# Patient Record
Sex: Male | Born: 1960 | Marital: Married | Smoking: Current every day smoker
Health system: Southern US, Community
[De-identification: ages and names within clinical notes are randomized; demographics above are authoritative.]

## PROBLEM LIST (undated history)

## (undated) DIAGNOSIS — N529 Male erectile dysfunction, unspecified: Secondary | ICD-10-CM

## (undated) DIAGNOSIS — E119 Type 2 diabetes mellitus without complications: Secondary | ICD-10-CM

## (undated) DIAGNOSIS — I10 Essential (primary) hypertension: Secondary | ICD-10-CM

## (undated) DIAGNOSIS — F32A Depression, unspecified: Secondary | ICD-10-CM

## (undated) DIAGNOSIS — S8291XA Unspecified fracture of right lower leg, initial encounter for closed fracture: Secondary | ICD-10-CM

## (undated) DIAGNOSIS — F329 Major depressive disorder, single episode, unspecified: Secondary | ICD-10-CM

## (undated) DIAGNOSIS — E785 Hyperlipidemia, unspecified: Secondary | ICD-10-CM

## (undated) HISTORY — PX: BRAIN SURGERY: SHX531

---

## 2014-08-05 ENCOUNTER — Emergency Department (HOSPITAL_COMMUNITY): Payer: Non-veteran care

## 2014-08-05 ENCOUNTER — Encounter (HOSPITAL_COMMUNITY): Payer: Self-pay

## 2014-08-05 ENCOUNTER — Inpatient Hospital Stay (HOSPITAL_COMMUNITY)
Admission: EM | Admit: 2014-08-05 | Discharge: 2014-08-14 | DRG: 871 | Disposition: A | Discharge status: Home Health | Payer: Non-veteran care | Attending: Internal Medicine | Admitting: Internal Medicine

## 2014-08-05 DIAGNOSIS — F1010 Alcohol abuse, uncomplicated: Secondary | ICD-10-CM | POA: Diagnosis present

## 2014-08-05 DIAGNOSIS — F32A Depression, unspecified: Secondary | ICD-10-CM | POA: Diagnosis present

## 2014-08-05 DIAGNOSIS — N12 Tubulo-interstitial nephritis, not specified as acute or chronic: Secondary | ICD-10-CM | POA: Diagnosis present

## 2014-08-05 DIAGNOSIS — N390 Urinary tract infection, site not specified: Secondary | ICD-10-CM | POA: Diagnosis not present

## 2014-08-05 DIAGNOSIS — E785 Hyperlipidemia, unspecified: Secondary | ICD-10-CM | POA: Diagnosis present

## 2014-08-05 DIAGNOSIS — E119 Type 2 diabetes mellitus without complications: Secondary | ICD-10-CM

## 2014-08-05 DIAGNOSIS — E876 Hypokalemia: Secondary | ICD-10-CM | POA: Diagnosis present

## 2014-08-05 DIAGNOSIS — A4102 Sepsis due to Methicillin resistant Staphylococcus aureus: Principal | ICD-10-CM | POA: Diagnosis present

## 2014-08-05 DIAGNOSIS — N200 Calculus of kidney: Secondary | ICD-10-CM | POA: Diagnosis present

## 2014-08-05 DIAGNOSIS — A419 Sepsis, unspecified organism: Secondary | ICD-10-CM

## 2014-08-05 DIAGNOSIS — F329 Major depressive disorder, single episode, unspecified: Secondary | ICD-10-CM | POA: Diagnosis present

## 2014-08-05 DIAGNOSIS — G9341 Metabolic encephalopathy: Secondary | ICD-10-CM | POA: Diagnosis present

## 2014-08-05 DIAGNOSIS — K7290 Hepatic failure, unspecified without coma: Secondary | ICD-10-CM | POA: Diagnosis present

## 2014-08-05 DIAGNOSIS — R32 Unspecified urinary incontinence: Secondary | ICD-10-CM | POA: Diagnosis present

## 2014-08-05 DIAGNOSIS — I639 Cerebral infarction, unspecified: Secondary | ICD-10-CM

## 2014-08-05 DIAGNOSIS — I10 Essential (primary) hypertension: Secondary | ICD-10-CM | POA: Diagnosis present

## 2014-08-05 DIAGNOSIS — D649 Anemia, unspecified: Secondary | ICD-10-CM | POA: Diagnosis present

## 2014-08-05 DIAGNOSIS — R7989 Other specified abnormal findings of blood chemistry: Secondary | ICD-10-CM | POA: Diagnosis present

## 2014-08-05 DIAGNOSIS — E871 Hypo-osmolality and hyponatremia: Secondary | ICD-10-CM | POA: Diagnosis present

## 2014-08-05 DIAGNOSIS — R4182 Altered mental status, unspecified: Secondary | ICD-10-CM | POA: Diagnosis present

## 2014-08-05 DIAGNOSIS — D696 Thrombocytopenia, unspecified: Secondary | ICD-10-CM | POA: Diagnosis present

## 2014-08-05 DIAGNOSIS — D638 Anemia in other chronic diseases classified elsewhere: Secondary | ICD-10-CM | POA: Diagnosis present

## 2014-08-05 DIAGNOSIS — B9562 Methicillin resistant Staphylococcus aureus infection as the cause of diseases classified elsewhere: Secondary | ICD-10-CM | POA: Diagnosis present

## 2014-08-05 DIAGNOSIS — R7881 Bacteremia: Secondary | ICD-10-CM | POA: Diagnosis present

## 2014-08-05 DIAGNOSIS — R319 Hematuria, unspecified: Secondary | ICD-10-CM

## 2014-08-05 DIAGNOSIS — F17210 Nicotine dependence, cigarettes, uncomplicated: Secondary | ICD-10-CM | POA: Diagnosis present

## 2014-08-05 DIAGNOSIS — D759 Disease of blood and blood-forming organs, unspecified: Secondary | ICD-10-CM | POA: Diagnosis present

## 2014-08-05 HISTORY — DX: Male erectile dysfunction, unspecified: N52.9

## 2014-08-05 HISTORY — DX: Type 2 diabetes mellitus without complications: E11.9

## 2014-08-05 HISTORY — DX: Hyperlipidemia, unspecified: E78.5

## 2014-08-05 HISTORY — DX: Depression, unspecified: F32.A

## 2014-08-05 HISTORY — DX: Essential (primary) hypertension: I10

## 2014-08-05 HISTORY — DX: Major depressive disorder, single episode, unspecified: F32.9

## 2014-08-05 HISTORY — DX: Unspecified fracture of right lower leg, initial encounter for closed fracture: S82.91XA

## 2014-08-05 LAB — DIFFERENTIAL
BASOS ABS: 0 10*3/uL (ref 0.0–0.1)
Basophils Relative: 0 % (ref 0–1)
EOS ABS: 0 10*3/uL (ref 0.0–0.7)
Eosinophils Relative: 0 % (ref 0–5)
LYMPHS ABS: 0.8 10*3/uL (ref 0.7–4.0)
LYMPHS PCT: 11 % — AB (ref 12–46)
MONO ABS: 0.4 10*3/uL (ref 0.1–1.0)
MONOS PCT: 6 % (ref 3–12)
Neutro Abs: 6 10*3/uL (ref 1.7–7.7)
Neutrophils Relative %: 83 % — ABNORMAL HIGH (ref 43–77)

## 2014-08-05 LAB — COMPREHENSIVE METABOLIC PANEL
ALK PHOS: 95 U/L (ref 39–117)
ALT: 27 U/L (ref 0–53)
AST: 69 U/L — AB (ref 0–37)
Albumin: 3.4 g/dL — ABNORMAL LOW (ref 3.5–5.2)
Anion gap: 14 (ref 5–15)
BILIRUBIN TOTAL: 3.1 mg/dL — AB (ref 0.3–1.2)
BUN: 13 mg/dL (ref 6–23)
CHLORIDE: 101 mmol/L (ref 96–112)
CO2: 21 mmol/L (ref 19–32)
Calcium: 9.3 mg/dL (ref 8.4–10.5)
Creatinine, Ser: 1.04 mg/dL (ref 0.50–1.35)
GFR calc non Af Amer: 79 mL/min — ABNORMAL LOW (ref >90)
GLUCOSE: 120 mg/dL — AB (ref 70–99)
POTASSIUM: 4.4 mmol/L (ref 3.5–5.1)
SODIUM: 136 mmol/L (ref 135–145)
Total Protein: 7.4 g/dL (ref 6.0–8.3)

## 2014-08-05 LAB — URINALYSIS, ROUTINE W REFLEX MICROSCOPIC
Glucose, UA: 100 mg/dL — AB
Ketones, ur: 40 mg/dL — AB
Nitrite: POSITIVE — AB
Specific Gravity, Urine: 1.014 (ref 1.005–1.030)
UROBILINOGEN UA: 1 mg/dL (ref 0.0–1.0)
pH: 6 (ref 5.0–8.0)

## 2014-08-05 LAB — CBC
HCT: 38.8 % — ABNORMAL LOW (ref 39.0–52.0)
Hemoglobin: 12.8 g/dL — ABNORMAL LOW (ref 13.0–17.0)
MCH: 31.4 pg (ref 26.0–34.0)
MCHC: 33 g/dL (ref 30.0–36.0)
MCV: 95.1 fL (ref 78.0–100.0)
PLATELETS: 45 10*3/uL — AB (ref 150–400)
RBC: 4.08 MIL/uL — ABNORMAL LOW (ref 4.22–5.81)
RDW: 15.6 % — ABNORMAL HIGH (ref 11.5–15.5)
WBC: 7.2 10*3/uL (ref 4.0–10.5)

## 2014-08-05 LAB — I-STAT CHEM 8, ED
BUN: 14 mg/dL (ref 6–23)
CREATININE: 0.8 mg/dL (ref 0.50–1.35)
Calcium, Ion: 1.08 mmol/L — ABNORMAL LOW (ref 1.12–1.23)
Chloride: 101 mmol/L (ref 96–112)
Glucose, Bld: 127 mg/dL — ABNORMAL HIGH (ref 70–99)
HCT: 42 % (ref 39.0–52.0)
Hemoglobin: 14.3 g/dL (ref 13.0–17.0)
Potassium: 4.3 mmol/L (ref 3.5–5.1)
Sodium: 136 mmol/L (ref 135–145)
TCO2: 18 mmol/L (ref 0–100)

## 2014-08-05 LAB — RAPID URINE DRUG SCREEN, HOSP PERFORMED
Amphetamines: NOT DETECTED
Barbiturates: NOT DETECTED
Benzodiazepines: NOT DETECTED
COCAINE: NOT DETECTED
Opiates: NOT DETECTED
Tetrahydrocannabinol: NOT DETECTED

## 2014-08-05 LAB — PROTIME-INR
INR: 1.35 (ref 0.00–1.49)
Prothrombin Time: 16.8 seconds — ABNORMAL HIGH (ref 11.6–15.2)

## 2014-08-05 LAB — URINE MICROSCOPIC-ADD ON

## 2014-08-05 LAB — CBG MONITORING, ED: Glucose-Capillary: 157 mg/dL — ABNORMAL HIGH (ref 70–99)

## 2014-08-05 LAB — APTT: APTT: 30 s (ref 24–37)

## 2014-08-05 LAB — I-STAT CG4 LACTIC ACID, ED: Lactic Acid, Venous: 5.07 mmol/L (ref 0.5–2.0)

## 2014-08-05 LAB — AMMONIA: Ammonia: 86 umol/L — ABNORMAL HIGH (ref 11–32)

## 2014-08-05 LAB — ETHANOL

## 2014-08-05 LAB — I-STAT TROPONIN, ED: Troponin i, poc: 0 ng/mL (ref 0.00–0.08)

## 2014-08-05 MED ORDER — LORAZEPAM 2 MG/ML IJ SOLN
1.0000 mg | Freq: Once | INTRAMUSCULAR | Status: DC
Start: 2014-08-05 — End: 2014-08-07
  Filled 2014-08-05: qty 1

## 2014-08-05 MED ORDER — DEXTROSE 5 % IV SOLN
1.0000 g | Freq: Once | INTRAVENOUS | Status: AC
Start: 2014-08-06 — End: 2014-08-06
  Administered 2014-08-05: 1 g via INTRAVENOUS
  Filled 2014-08-05: qty 10

## 2014-08-05 MED ORDER — SODIUM CHLORIDE 0.9 % IV BOLUS (SEPSIS)
500.0000 mL | INTRAVENOUS | Status: AC
  Administered 2014-08-06: 500 mL via INTRAVENOUS

## 2014-08-05 MED ORDER — CEFTRIAXONE SODIUM 1 G IJ SOLR
1.0000 g | Freq: Once | INTRAMUSCULAR | Status: AC
  Administered 2014-08-05: 1 g via INTRAVENOUS
  Filled 2014-08-05: qty 10

## 2014-08-05 MED ORDER — SODIUM CHLORIDE 0.9 % IV BOLUS (SEPSIS)
1000.0000 mL | INTRAVENOUS | Status: AC
  Administered 2014-08-06: 1000 mL via INTRAVENOUS

## 2014-08-05 MED ORDER — SODIUM CHLORIDE 0.9 % IV BOLUS (SEPSIS)
1000.0000 mL | Freq: Once | INTRAVENOUS | Status: AC
Start: 2014-08-05 — End: 2014-08-06
  Administered 2014-08-05: 1000 mL via INTRAVENOUS

## 2014-08-05 NOTE — ED Notes (Signed)
Family has not yet arrived.

## 2014-08-05 NOTE — ED Notes (Signed)
Called Lvl 2 Code Sepsis to [facility] @2350 

## 2014-08-05 NOTE — ED Notes (Signed)
Pt wife talked with April Pugh, RN - 1700 pt stated he was sick; vomited and couldn't remember how to put car in gear, stopped car & got in back of car, went to sleep. Pt has hx of kidney infection x1 month ago. Pt did not drink today, but typically drinks 3 24oz each night.

## 2014-08-05 NOTE — Consult Note (Signed)
Neurology Consultation Reason for Consult: Altered Mental Status Referring Physician: Ardeen JourdainBelfi, M  CC: Altered Mental status  History is obtained from:patient, EMS  HPI: John Santiago is a 54 y.o. male with a history of hypertension who presents with AMS. He had an episode of vomiting earlier, and subsequently was confused and therefore lay down in the back of the car. After arousing him, he was seen to be weak on the left side and EMS was called. EMS states that he did appear to be weak on the left side brought into the emergency room. Glucose was 135 in the field. By the time of arrival to the emergency room, his left-sided weakness had resolved with persistent confusion and left facial drooping.  He was evaluated as a code stroke, taken for emergent MRI which was negative.   LKW: unclear tpa given?: no,     ROS:  Unable to obtain due to altered mental status.   PMH: HTN  Family History: Unable to assess secondary to patient's altered mental status.    Social History: Per nursing contacted his family, he drinks 32 4 ounce beers per day, last drink was yesterday.    Exam: Current vital signs: BP 165/83 mmHg  Pulse 107  Temp(Src) 98.5 F (36.9 C) (Oral)  Resp 25  SpO2 96% Vital signs in last 24 hours: Temp:  [98.5 F (36.9 C)] 98.5 F (36.9 C) (03/21 2053) Pulse Rate:  [107] 107 (03/21 2053) Resp:  [25] 25 (03/21 2053) BP: (165)/(83) 165/83 mmHg (03/21 2053) SpO2:  [96 %] 96 % (03/21 2053)  Physical Exam  Constitutional: Appears well-developed and well-nourished.  Psych: somnolent, but does interact Eyes: No scleral injection HENT: No OP obstrucion Head: Normocephalic.  Cardiovascular: Normal rate and regular rhythm.  Respiratory: Effort normal and breath sounds normal to anterior ascultation GI: Soft.  No distension. There is no tenderness.  Skin: WDI  Neuro: Mental Status: Patient is lethargic, but when aroused will answer questions. He is able to give the  month, does not get the year. He is able to follow some simple commands, but does not do so reliably. Cranial Nerves: II: Visual Fields are full. Pupils are equal, round, and reactive to light.   III,IV, VI: EOMI without ptosis or diploplia.  V: Facial sensation is symmetric to temperature VII: Facial movement is notable for left facial droop VIII: hearing is intact to voice X: Uvula elevates symmetrically XI: Shoulder shrug is symmetric. XII: tongue is midline without atrophy or fasciculations.  Motor: Tone is normal. Bulk is normal. 5/5 strength was present in all four extremities.  Sensory: Sensation is symmetric to light touch and temperature in the arms and legs. Deep Tendon Reflexes: 2+ and symmetric in the biceps and patellae.  Cerebellar: He has marketed tremor bilaterally and slow to do finger-nose-finger, but no clear ataxia.   I have reviewed labs in epic and the results pertinent to this consultation are: Ammonia 82  I have reviewed the images obtained: MRI brain-negative  Impression: 54 year old male presenting with altered mental status. His exam at the time of my seeing him was much more consistent with a metabolic encephalopathy than stroke. His elevated ammonia level does give question to the possibility that this is a hepatic encephalopathy. The focal symptoms that he had earlier would be quite unusual. Seizures do happen sometimes in the context of hepatic encephalopathy.  Recommendations: 1) EEG 2) would treat suspected hepatic encephalopathy 3) neurology will continue to follow.   Ritta SlotMcNeill Jarom Govan, MD [facility] [phone]  If 7pm- 7am, please page neurology on call as listed in [place].

## 2014-08-05 NOTE — Code Documentation (Signed)
Mr. Reola CalkinsGoode is a 53yo wm that was [facility] after pulling over while driving and telling his wife he could no longer drive.  Initially he told EMS he had a HA & was found to have Lt side weakness.  [facility] he was altered but responsive, speech clear, bil tremors, but unable to answer questions consistently.  Unclear LKW as the family is unavailable. NIH 5 mostly for mental status, drift on BLE.  Pt states he was in the [employer] and has HTN but unclear meds, and unable to identify his PCP.

## 2014-08-05 NOTE — ED Provider Notes (Signed)
CSN: [id number]     Arrival date & time 08/05/14  2023 History   First MD Initiated Contact with Patient 08/05/14 2027     Chief Complaint  Patient presents with  . Code Stroke     (Consider location/radiation/quality/duration/timing/severity/associated sxs/prior Treatment) HPI Comments: Patient presents as a code stroke. Per EMS he was driving with his wife when he pulled over and said that he was unable to drive anymore. Per EMS, they felt he had some left-sided facial drooping and left-sided weakness. He denies any chest pain or shortness of breath. He was incontinent of urine. He denies any recent illnesses. History is limited due to his condition. He does report that he's been drinking alcohol tonight.   History reviewed. No pertinent past medical history. No past surgical history on file. No family history on file. History  Substance Use Topics  . Smoking status: Not on file  . Smokeless tobacco: Not on file  . Alcohol Use: Not on file    Review of Systems  Unable to perform ROS: Mental status change      Allergies  Review of patient's allergies indicates not on file.  Home Medications   Prior to Admission medications   Not on File   BP 138/77 mmHg  Pulse 112  Temp(Src) 98.5 F (36.9 C) (Oral)  Resp 25  SpO2 95% Physical Exam  Constitutional: He appears well-developed and well-nourished.  HENT:  Head: Normocephalic and atraumatic.  Eyes: Pupils are equal, round, and reactive to light.  Neck: Normal range of motion. Neck supple.  Cardiovascular: Normal rate, regular rhythm and normal heart sounds.   Pulmonary/Chest: Effort normal and breath sounds normal. No respiratory distress. He has no wheezes. He has no rales. He exhibits no tenderness.  Abdominal: Soft. Bowel sounds are normal. There is no tenderness. There is no rebound and no guarding.  Musculoskeletal: Normal range of motion. He exhibits no edema.  Lymphadenopathy:    He has no cervical adenopathy.   Neurological: He is alert.  Patient is very slow to answer questions. He seems disoriented. He does have a little bit of left-sided facial drooping. He has a limited exam but he seems to have normal motor strength in all extremities. He has a tremor to his upper extremities bilaterally which appears to be chronic per his report.  Skin: Skin is warm and dry. No rash noted.  Psychiatric: He has a normal mood and affect.    ED Course  Procedures (including critical care time) Labs Review Labs Reviewed  PROTIME-INR - Abnormal; Notable for the following:    Prothrombin Time 16.8 (*)    All other components within normal limits  CBC - Abnormal; Notable for the following:    RBC 4.08 (*)    Hemoglobin 12.8 (*)    HCT 38.8 (*)    RDW 15.6 (*)    Platelets 45 (*)    All other components within normal limits  DIFFERENTIAL - Abnormal; Notable for the following:    Neutrophils Relative % 83 (*)    Lymphocytes Relative 11 (*)    All other components within normal limits  COMPREHENSIVE METABOLIC PANEL - Abnormal; Notable for the following:    Glucose, Bld 120 (*)    Albumin 3.4 (*)    AST 69 (*)    Total Bilirubin 3.1 (*)    GFR calc non Af Amer 79 (*)    All other components within normal limits  URINALYSIS, ROUTINE W REFLEX MICROSCOPIC - Abnormal; Notable  for the following:    Color, Urine RED (*)    APPearance TURBID (*)    Glucose, UA 100 (*)    Hgb urine dipstick LARGE (*)    Bilirubin Urine LARGE (*)    Ketones, ur 40 (*)    Protein, ur >300 (*)    Nitrite POSITIVE (*)    Leukocytes, UA LARGE (*)    All other components within normal limits  AMMONIA - Abnormal; Notable for the following:    Ammonia 86 (*)    All other components within normal limits  URINE MICROSCOPIC-ADD ON - Abnormal; Notable for the following:    Bacteria, UA MANY (*)    All other components within normal limits  I-STAT CHEM 8, ED - Abnormal; Notable for the following:    Glucose, Bld 127 (*)     Calcium, Ion 1.08 (*)    All other components within normal limits  CBG MONITORING, ED - Abnormal; Notable for the following:    Glucose-Capillary 157 (*)    All other components within normal limits  I-STAT CG4 LACTIC ACID, ED - Abnormal; Notable for the following:    Lactic Acid, Venous 5.07 (*)    All other components within normal limits  URINE CULTURE  CULTURE, BLOOD (ROUTINE X 2)  CULTURE, BLOOD (ROUTINE X 2)  ETHANOL  APTT  URINE RAPID DRUG SCREEN (HOSP PERFORMED)  LIPASE, BLOOD  I-STAT TROPOININ, ED  I-STAT TROPOININ, ED  I-STAT CG4 LACTIC ACID, ED    Imaging Review Ct Head Wo Contrast  08/05/2014   CLINICAL DATA:  Slurred speech and facial droop today. Some confusion.  EXAM: CT HEAD WITHOUT CONTRAST  TECHNIQUE: Contiguous axial images were obtained from the base of the skull through the vertex without intravenous contrast.  COMPARISON:  None.  FINDINGS: Age advanced cerebral atrophy and ventriculomegaly. No extra-axial fluid collections are identified. No CT findings for acute hemispheric infarction or intracranial hemorrhage. No mass lesions. The brainstem and cerebellum are grossly normal. Vascular calcifications are noted.  The bony structures are intact. The paranasal sinuses and mastoid air cells are clear. Patient has had prior left mastoid surgery with mastoid bowl procedure.  IMPRESSION: No CT findings for an acute intracranial process.   Electronically Signed   By: Rudie Meyer M.D.   On: 08/05/2014 20:48   Mr Brain Wo Contrast  08/05/2014   CLINICAL DATA:  54 year old male code stroke with slurred speech and facial droop. Initial encounter.  EXAM: MRI HEAD WITHOUT CONTRAST  TECHNIQUE: Multiplanar, multiecho pulse sequences of the brain and surrounding structures were obtained without intravenous contrast.  COMPARISON:  Head CT without contrast 2037 hours the same day.  FINDINGS: Noncontrast MRI imaging of the brain was limited to diffusion-weighted (axial and coronal),  and axial FLAIR imaging.  No restricted diffusion or evidence of acute infarction. Patchy cerebral white matter FLAIR hyperintensity, mild to moderate for age. No cortical encephalomalacia identified. No midline shift, mass effect, or evidence of intracranial mass lesion. No ventriculomegaly.  IMPRESSION: No evidence of acute ischemia. Study discussed by telephone with Dr. Ritta Slot on 08/05/2014 at 21:44 .   Electronically Signed   By: Odessa Fleming M.D.   On: 08/05/2014 21:45     EKG Interpretation   Date/Time:  Monday August 05 2014 20:43:35 EDT Ventricular Rate:  109 PR Interval:  139 QRS Duration: 85 QT Interval:  343 QTC Calculation: 462 R Axis:   83 Text Interpretation:  Sinus tachycardia Ventricular premature complex  Aberrant  conduction of SV complex(es) No old tracing to compare Confirmed  by Jenyfer Trawick  MD, Field Staniszewski [phone]([id number]) on 08/05/2014 9:11:40 PM      MDM   Final diagnoses:  Sepsis, due to unspecified organism  UTI (lower urinary tract infection)  Altered mental status, unspecified altered mental status type    Patient presents to the ED as a code stroke. However he seems to be more altered mental status. He did seem on presentation to have a little bit of left-sided facial drooping but otherwise doesn't have any focal neurologic deficits. He was seen by the stroke team and had a CT scan and MRI which did not show any acute infarct or bleed. His wife showed up and was able to provide more information. She states that the patient was doing well until this afternoon when he was at a doctor's office visit with her and said he wasn't feeling well. He started having some vomiting and chills. He was treated about a month ago for a UTI. She thinks that it never really cleared up because he's been urinating frequently but he hasn't gone back to get checked out again. Patient has a little more alert this point but is still confused. He will answer questions and he knows his name and that he  is in a hospital but he doesn't know the name of the hospital. He denies any headache or neck pain. He has not been febrile in the ED. However he has been tachycardic. His urine shows evidence of infection which I feel is likely origin of his altered mental status and probable sepsis. His lactate came back at 5. He's also tachycardic and this along with altered mental status I believe indicates urosepsis. In addition his ammonia level is elevated. He is a chronic drinker. This likely explains his tremor. His wife does not know of any ongoing liver disease. Patient was started on septic protocol and started on Rocephin for probable urosepsis. He was given IV fluid boluses. I will consult the hospitalist for admission.  CRITICAL CARE Performed by: Gaylon Melchor Total critical care time: 60 Critical care time was exclusive of separately billable procedures and treating other patients. Critical care was necessary to treat or prevent imminent or life-threatening deterioration. Critical care was time spent personally by me on the following activities: development of treatment plan with patient and/or surrogate as well as nursing, discussions with consultants, evaluation of patient's response to treatment, examination of patient, obtaining history from patient or surrogate, ordering and performing treatments and interventions, ordering and review of laboratory studies, ordering and review of radiographic studies, pulse oximetry and re-evaluation of patient's condition.     Rolan BuccoMelanie Azaela Caracci, MD 08/06/14 [phone]

## 2014-08-05 NOTE — ED Notes (Signed)
CBG 157 

## 2014-08-05 NOTE — ED Notes (Signed)
i-stat CG4 lactic acid shown to Dr. Fredderick PhenixBelfi

## 2014-08-06 ENCOUNTER — Inpatient Hospital Stay (HOSPITAL_COMMUNITY): Payer: Non-veteran care

## 2014-08-06 DIAGNOSIS — R410 Disorientation, unspecified: Secondary | ICD-10-CM

## 2014-08-06 DIAGNOSIS — D759 Disease of blood and blood-forming organs, unspecified: Secondary | ICD-10-CM | POA: Diagnosis present

## 2014-08-06 DIAGNOSIS — R32 Unspecified urinary incontinence: Secondary | ICD-10-CM | POA: Diagnosis present

## 2014-08-06 DIAGNOSIS — R7881 Bacteremia: Secondary | ICD-10-CM | POA: Diagnosis present

## 2014-08-06 DIAGNOSIS — N12 Tubulo-interstitial nephritis, not specified as acute or chronic: Secondary | ICD-10-CM | POA: Diagnosis present

## 2014-08-06 DIAGNOSIS — A4102 Sepsis due to Methicillin resistant Staphylococcus aureus: Secondary | ICD-10-CM | POA: Diagnosis present

## 2014-08-06 DIAGNOSIS — I10 Essential (primary) hypertension: Secondary | ICD-10-CM | POA: Diagnosis present

## 2014-08-06 DIAGNOSIS — N390 Urinary tract infection, site not specified: Secondary | ICD-10-CM | POA: Diagnosis present

## 2014-08-06 DIAGNOSIS — F329 Major depressive disorder, single episode, unspecified: Secondary | ICD-10-CM | POA: Diagnosis present

## 2014-08-06 DIAGNOSIS — K7290 Hepatic failure, unspecified without coma: Secondary | ICD-10-CM | POA: Diagnosis present

## 2014-08-06 DIAGNOSIS — E785 Hyperlipidemia, unspecified: Secondary | ICD-10-CM | POA: Diagnosis present

## 2014-08-06 DIAGNOSIS — R4182 Altered mental status, unspecified: Secondary | ICD-10-CM | POA: Diagnosis present

## 2014-08-06 DIAGNOSIS — N200 Calculus of kidney: Secondary | ICD-10-CM | POA: Diagnosis present

## 2014-08-06 DIAGNOSIS — D696 Thrombocytopenia, unspecified: Secondary | ICD-10-CM | POA: Diagnosis present

## 2014-08-06 DIAGNOSIS — E119 Type 2 diabetes mellitus without complications: Secondary | ICD-10-CM | POA: Diagnosis present

## 2014-08-06 DIAGNOSIS — F17210 Nicotine dependence, cigarettes, uncomplicated: Secondary | ICD-10-CM | POA: Diagnosis present

## 2014-08-06 DIAGNOSIS — A419 Sepsis, unspecified organism: Secondary | ICD-10-CM

## 2014-08-06 DIAGNOSIS — R319 Hematuria, unspecified: Secondary | ICD-10-CM

## 2014-08-06 DIAGNOSIS — G9341 Metabolic encephalopathy: Secondary | ICD-10-CM | POA: Diagnosis present

## 2014-08-06 DIAGNOSIS — F1010 Alcohol abuse, uncomplicated: Secondary | ICD-10-CM | POA: Diagnosis present

## 2014-08-06 DIAGNOSIS — I340 Nonrheumatic mitral (valve) insufficiency: Secondary | ICD-10-CM | POA: Diagnosis not present

## 2014-08-06 DIAGNOSIS — D638 Anemia in other chronic diseases classified elsewhere: Secondary | ICD-10-CM | POA: Diagnosis present

## 2014-08-06 DIAGNOSIS — E871 Hypo-osmolality and hyponatremia: Secondary | ICD-10-CM | POA: Diagnosis present

## 2014-08-06 DIAGNOSIS — E876 Hypokalemia: Secondary | ICD-10-CM | POA: Diagnosis present

## 2014-08-06 LAB — LIPASE, BLOOD: LIPASE: 26 U/L (ref 11–59)

## 2014-08-06 LAB — COMPREHENSIVE METABOLIC PANEL
ALT: 20 U/L (ref 0–53)
ANION GAP: 10 (ref 5–15)
AST: 40 U/L — ABNORMAL HIGH (ref 0–37)
Albumin: 2.7 g/dL — ABNORMAL LOW (ref 3.5–5.2)
Alkaline Phosphatase: 70 U/L (ref 39–117)
BUN: 10 mg/dL (ref 6–23)
CHLORIDE: 105 mmol/L (ref 96–112)
CO2: 22 mmol/L (ref 19–32)
CREATININE: 0.9 mg/dL (ref 0.50–1.35)
Calcium: 8.2 mg/dL — ABNORMAL LOW (ref 8.4–10.5)
GFR calc non Af Amer: >90 mL/min (ref >90)
Glucose, Bld: 146 mg/dL — ABNORMAL HIGH (ref 70–99)
Potassium: 3.5 mmol/L (ref 3.5–5.1)
SODIUM: 137 mmol/L (ref 135–145)
TOTAL PROTEIN: 6.1 g/dL (ref 6.0–8.3)
Total Bilirubin: 2.1 mg/dL — ABNORMAL HIGH (ref 0.3–1.2)

## 2014-08-06 LAB — CBC
HCT: 35.2 % — ABNORMAL LOW (ref 39.0–52.0)
HEMOGLOBIN: 11.8 g/dL — AB (ref 13.0–17.0)
MCH: 31.6 pg (ref 26.0–34.0)
MCHC: 33.5 g/dL (ref 30.0–36.0)
MCV: 94.1 fL (ref 78.0–100.0)
Platelets: 42 10*3/uL — ABNORMAL LOW (ref 150–400)
RBC: 3.74 MIL/uL — AB (ref 4.22–5.81)
RDW: 15.6 % — ABNORMAL HIGH (ref 11.5–15.5)
WBC: 8.6 10*3/uL (ref 4.0–10.5)

## 2014-08-06 LAB — GLUCOSE, CAPILLARY
GLUCOSE-CAPILLARY: 148 mg/dL — AB (ref 70–99)
GLUCOSE-CAPILLARY: 125 mg/dL — AB (ref 70–99)
Glucose-Capillary: 161 mg/dL — ABNORMAL HIGH (ref 70–99)
Glucose-Capillary: 133 mg/dL — ABNORMAL HIGH (ref 70–99)
Glucose-Capillary: 153 mg/dL — ABNORMAL HIGH (ref 70–99)

## 2014-08-06 LAB — LACTIC ACID, PLASMA
Lactic Acid, Venous: 4.7 mmol/L (ref 0.5–2.0)
Lactic Acid, Venous: 3.1 mmol/L (ref 0.5–2.0)
Lactic Acid, Venous: 2.5 mmol/L (ref 0.5–2.0)
Lactic Acid, Venous: 2.4 mmol/L (ref 0.5–2.0)

## 2014-08-06 LAB — MRSA PCR SCREENING: MRSA BY PCR: POSITIVE — AB

## 2014-08-06 LAB — BASIC METABOLIC PANEL
ANION GAP: 13 (ref 5–15)
BUN: 11 mg/dL (ref 6–23)
CALCIUM: 8.5 mg/dL (ref 8.4–10.5)
CO2: 20 mmol/L (ref 19–32)
CREATININE: 0.92 mg/dL (ref 0.50–1.35)
Chloride: 104 mmol/L (ref 96–112)
GFR calc Af Amer: >90 mL/min (ref >90)
Glucose, Bld: 147 mg/dL — ABNORMAL HIGH (ref 70–99)
Potassium: 3.8 mmol/L (ref 3.5–5.1)
SODIUM: 137 mmol/L (ref 135–145)

## 2014-08-06 LAB — AMMONIA: Ammonia: 85 umol/L — ABNORMAL HIGH (ref 11–32)

## 2014-08-06 LAB — PROTIME-INR
INR: 1.66 — AB (ref 0.00–1.49)
PROTHROMBIN TIME: 19.8 s — AB (ref 11.6–15.2)

## 2014-08-06 LAB — MAGNESIUM: Magnesium: 1.4 mg/dL — ABNORMAL LOW (ref 1.5–2.5)

## 2014-08-06 LAB — HEMOGLOBIN: HEMOGLOBIN: 11.2 g/dL — AB (ref 13.0–17.0)

## 2014-08-06 LAB — TSH: TSH: 1.402 u[IU]/mL (ref 0.350–4.500)

## 2014-08-06 MED ORDER — CEFEPIME HCL 1 G IJ SOLR
1.0000 g | Freq: Three times a day (TID) | INTRAMUSCULAR | Status: DC
  Administered 2014-08-06 – 2014-08-07 (×4): 1 g via INTRAVENOUS
  Filled 2014-08-06 (×6): qty 1

## 2014-08-06 MED ORDER — SODIUM CHLORIDE 0.9 % IV BOLUS (SEPSIS)
1000.0000 mL | Freq: Once | INTRAVENOUS | Status: AC
  Administered 2014-08-06: 1000 mL via INTRAVENOUS

## 2014-08-06 MED ORDER — ONDANSETRON HCL 4 MG PO TABS: 4.0000 mg | ORAL_TABLET | Freq: Four times a day (QID) | ORAL | Status: DC | PRN

## 2014-08-06 MED ORDER — SODIUM CHLORIDE 0.9 % IV BOLUS (SEPSIS): 500.0000 mL | Freq: Once | INTRAVENOUS | Status: DC

## 2014-08-06 MED ORDER — MUPIROCIN 2 % EX OINT
1.0000 "application " | TOPICAL_OINTMENT | Freq: Two times a day (BID) | CUTANEOUS | Status: AC
  Administered 2014-08-06 – 2014-08-10 (×10): 1 via NASAL
  Filled 2014-08-06 (×2): qty 22

## 2014-08-06 MED ORDER — INFLUENZA VAC SPLIT QUAD 0.5 ML IM SUSY
0.5000 mL | PREFILLED_SYRINGE | INTRAMUSCULAR | Status: AC
  Administered 2014-08-08: 0.5 mL via INTRAMUSCULAR
  Filled 2014-08-06: qty 0.5

## 2014-08-06 MED ORDER — PIPERACILLIN-TAZOBACTAM 3.375 G IVPB
3.3750 g | Freq: Three times a day (TID) | INTRAVENOUS | Status: DC
  Administered 2014-08-06: 3.375 g via INTRAVENOUS
  Filled 2014-08-06 (×3): qty 50

## 2014-08-06 MED ORDER — ACETAMINOPHEN 325 MG PO TABS: 650.0000 mg | ORAL_TABLET | Freq: Four times a day (QID) | ORAL | Status: DC | PRN

## 2014-08-06 MED ORDER — CHLORHEXIDINE GLUCONATE 0.12 % MT SOLN
15.0000 mL | Freq: Two times a day (BID) | OROMUCOSAL | Status: DC
  Administered 2014-08-06 – 2014-08-13 (×12): 15 mL via OROMUCOSAL
  Filled 2014-08-06 (×18): qty 15

## 2014-08-06 MED ORDER — MORPHINE SULFATE 2 MG/ML IJ SOLN
1.0000 mg | INTRAMUSCULAR | Status: DC | PRN
  Administered 2014-08-07 (×2): 1 mg via INTRAVENOUS
  Filled 2014-08-06 (×2): qty 1

## 2014-08-06 MED ORDER — SODIUM CHLORIDE 0.9 % IV SOLN
INTRAVENOUS | Status: DC
  Administered 2014-08-06 – 2014-08-11 (×5): via INTRAVENOUS
  Administered 2014-08-12: 1 mL via INTRAVENOUS
  Administered 2014-08-12 – 2014-08-13 (×2): via INTRAVENOUS

## 2014-08-06 MED ORDER — HYDROCODONE-ACETAMINOPHEN 5-325 MG PO TABS: 1.0000 | ORAL_TABLET | ORAL | Status: DC | PRN

## 2014-08-06 MED ORDER — ACETAMINOPHEN 650 MG RE SUPP
650.0000 mg | Freq: Four times a day (QID) | RECTAL | Status: DC | PRN
  Administered 2014-08-06 – 2014-08-07 (×4): 650 mg via RECTAL
  Filled 2014-08-06 (×4): qty 1

## 2014-08-06 MED ORDER — ONDANSETRON HCL 4 MG/2ML IJ SOLN
4.0000 mg | Freq: Four times a day (QID) | INTRAMUSCULAR | Status: DC | PRN
Start: 2014-08-06 — End: 2014-08-14

## 2014-08-06 MED ORDER — CETYLPYRIDINIUM CHLORIDE 0.05 % MT LIQD
7.0000 mL | Freq: Two times a day (BID) | OROMUCOSAL | Status: DC
  Administered 2014-08-06 – 2014-08-12 (×11): 7 mL via OROMUCOSAL

## 2014-08-06 MED ORDER — CHLORHEXIDINE GLUCONATE CLOTH 2 % EX PADS
6.0000 | MEDICATED_PAD | Freq: Every day | CUTANEOUS | Status: AC
  Administered 2014-08-06 – 2014-08-10 (×5): 6 via TOPICAL

## 2014-08-06 NOTE — Progress Notes (Signed)
This am pt lethargic, oriented to self only, MD called for update, currently MD on floor, new orders received on pt, pt's wife called with updates stated to this RN pt about 1 month ago [facility] at [facility] [place] visit and was c/o frequent urination, wife stated pt was given doxycyline for 10xdays, pt looked better but about a week and half to two weeks ago pt stated having frequent urination again and now he is in the hospital, MD updated and wife phone number placed in chart.

## 2014-08-06 NOTE — Progress Notes (Addendum)
0158 pt arrived from the ED via stretcher accompanied by RN. Pt transferred to bed and placed on monitor and assessed. Pt is AAX3 and stated that he has DM type 2 and takes oral medications for control. Pt stated "that he has had problems with the left side of his face". Np made aware. Pt able to follow simple commands at times. Pt stated "that he drinks a beer every night but no hard alcohol ". Will continue to monitor pt.

## 2014-08-06 NOTE — Progress Notes (Signed)
[facility] TEAM 1 - Stepdown/ICU TEAM Progress Note  John AlfredRory Batz YQM:[id number] DOB: 1960/12/26 DOA: 08/05/2014 PCP: No primary care provider on file.  Admit HPI / Brief Narrative: John Santiago is a 54 y.o. male, with no significant past medical history presenting with altered mental status. He was driving and stopped and told his wife he cannot drive anymore. The patient was brought to the emergency room and code stroke was called. Patient was evaluated by neurology, had a negative CT and MRI of the head. Patient is still confused and cannot give significant history. Denies any chest pains, shortness of breath abdominal pain but had an episode of vomiting. Patient reports drinking alcohol today but his alcohol level was less than 5. Patient had hematuria while he was in the emergency room and reports being [facility] a [facility]. No history of trauma. His ammonia level came back elevated although he's not a chronic alcoholic according to the family. His lactic acid level was over 5.   HPI/Subjective: 3/22 A/O 3 (does not recall why), states the last thing he remembers was sitting in the car waiting to pick up his son. Negative abdominal pain, negative CVA tenderness, negative N/V, negative F/C.   Assessment/Plan: Urosepsis hospital-acquired  -Patient was [facility] within the last 30 days [facility]; patient does not recall which to antibiotics he was given.  -Increase normal saline to 200 ml/hr -Check updated labs to include lactic acid -DC Zosyn, and start cefepime per urosepsis protocol -Echocardiogram pending  Recurrent urinary tract infection -See urosepsis  Hematuria -Monitor most likely secondary to urosepsis   Elevated ammonia level , INR  Code Status: FULL Family Communication: no family present at time of exam Disposition Plan: Resolution sepsis    Consultants: NA  Procedure/Significant Events: 3/22  echocardiogram;- Left ventricle: mild LVH.LVEF= 55% to 60%. 3/22 abdominal ultrasound; Gallbladder: Contracted in nature. Wall thickening is noted  - A minimal amount of pericholecystic fluid is noted. No gallstones are seen.   Culture 3/21 urine pending 3/21 blood pending 3/22 MRSA by PCR positive   Antibiotics: Ceftriaxone 3/21>>1 dose Zosyn 3/22>> stopped 3/22 Cefepime 3/22>>   DVT prophylaxis: SCD   Devices NA   LINES / TUBES:      Continuous Infusions: . sodium chloride 75 mL/hr at 08/06/14 0228    Objective: VITAL SIGNS: Temp: 102.2 F (39 C) (03/22 0630) Temp Source: Rectal (03/22 0630) BP: 119/78 mmHg (03/22 0700) Pulse Rate: 110 (03/22 0700) SPO2; FIO2:   Intake/Output Summary (Last 24 hours) at 08/06/14 0746 Last data filed at 08/06/14 0540  Gross per 24 hour  Intake   1165 ml  Output   1575 ml  Net   -410 ml     Exam: General: A/O 3 (does not recall why), NAD,  No acute respiratory distress Lungs: Clear to auscultation bilaterally without wheezes or crackles Cardiovascular: Regular rate and rhythm without murmur gallop or rub normal S1 and S2 Abdomen: Nontender, nondistended, soft, bowel sounds positive, no rebound, no ascites, no appreciable mass,Negative CVA tenderness  Extremities: No significant cyanosis, clubbing, or edema bilateral lower extremities  Data Reviewed: Basic Metabolic Panel:  Recent Labs Lab 08/05/14 2029 08/05/14 2035 08/06/14 0256  NA 136 136 137  K 4.4 4.3 3.8  CL 101 101 104  CO2 21  --  20  GLUCOSE 120* 127* 147*  BUN 13 14 11   CREATININE 1.04 0.80 0.92  CALCIUM 9.3  --  8.5   Liver Function Tests:  Recent Labs Lab 08/05/14 2029  AST 69*  ALT 27  ALKPHOS 95  BILITOT 3.1*  PROT 7.4  ALBUMIN 3.4*    Recent Labs Lab 08/05/14 2344  LIPASE 26    Recent Labs Lab 08/05/14 2155 08/06/14 0256  AMMONIA 86* 85*   CBC:  Recent Labs Lab 08/05/14 2029 08/05/14 2035 08/06/14 0256  WBC  7.2  --  8.6  NEUTROABS 6.0  --   --   HGB 12.8* 14.3 11.8*  HCT 38.8* 42.0 35.2*  MCV 95.1  --  94.1  PLT 45*  --  42*   Cardiac Enzymes: No results for input(s): CKTOTAL, CKMB, CKMBINDEX, TROPONINI in the last 168 hours. BNP (last 3 results) No results for input(s): BNP in the last 8760 hours.  ProBNP (last 3 results) No results for input(s): PROBNP in the last 8760 hours.  CBG:  Recent Labs Lab 08/05/14 2056 08/06/14 0142  GLUCAP 157* 148*    No results found for this or any previous visit (from the past 240 hour(s)).   Studies:  Recent x-ray studies have been reviewed in detail by the Attending Physician  Scheduled Meds:  Scheduled Meds: . LORazepam  1 mg Intravenous Once  . piperacillin-tazobactam (ZOSYN)  IV  3.375 g Intravenous Q8H    Time spent on care of this patient: 40 mins   Dajiah Kooi, Roselind Messier , MD  [facility] Office  [phone] Pager - [phone]  On-Call/Text Page:      [email or web address]      password [email or web address]  If 7PM-7AM, please contact night-coverage [email or web address] Password [facility] 08/06/2014, 7:46 AM   LOS: 0 days   Care during the described time interval was provided by me .  I have reviewed this patient's available data, including medical history, events of note, physical examination, radiology studies and test results as part of my evaluation  Carolyne Littles, MD [phone] Pager

## 2014-08-06 NOTE — ED Notes (Signed)
500 bolus started, unable to scan

## 2014-08-06 NOTE — Progress Notes (Signed)
UR COMPLETED  

## 2014-08-06 NOTE — Care Management Note (Unsigned)
    Page 1 of 1   08/06/2014     4:14:22 PM CARE MANAGEMENT NOTE 08/06/2014  Patient:  John Santiago,John Santiago   Account Number:  [id number]  Date Initiated:  08/06/2014  Documentation initiated by:  Gae GallopOLE,ANGELA  Subjective/Objective Assessment:   Pt from home admitted with sepsis.     Action/Plan:   Return to home when medically stable. CM to up with d/c needs.   Anticipated DC Date:     Anticipated DC Plan:        DC Planning Services  CM consult      Choice offered to / List presented to:             Status of service:  In process, will continue to follow Medicare Important Message given?  NO (If response is "NO", the following Medicare IM given date fields will be blank) Date Medicare IM given:   Medicare IM given by:   Date Additional Medicare IM given:   Additional Medicare IM given by:    Discharge Disposition:    Per UR Regulation:  Reviewed for med. necessity/level of care/duration of stay  If discussed at Long Length of Stay Meetings, dates discussed:    Comments:

## 2014-08-06 NOTE — Progress Notes (Signed)
Subjective: Patient is alert and oriented--has no complaints.   Objective: Current vital signs: BP 127/76 mmHg  Pulse 104  Temp(Src) 101.2 F (38.4 C) (Rectal)  Resp 20  Ht 5\' 7"  (1.702 m)  Wt 85.1 kg (187 lb 9.8 oz)  BMI 29.38 kg/m2  SpO2 96% Vital signs in last 24 hours: Temp:  [98.5 F (36.9 C)-103.3 F (39.6 C)] 101.2 F (38.4 C) (03/22 0855) Pulse Rate:  [103-125] 104 (03/22 0900) Resp:  [14-27] 20 (03/22 0900) BP: (116-165)/(60-92) 127/76 mmHg (03/22 0900) SpO2:  [93 %-99 %] 96 % (03/22 0900) Weight:  [85.1 kg (187 lb 9.8 oz)] 85.1 kg (187 lb 9.8 oz) (03/22 0211)  Intake/Output from previous day: 03/21 0701 - 03/22 0700 In: 1165 [I.V.:115; IV Piggyback:1050] Out: 2075 [Urine:2075] Intake/Output this shift:   Nutritional status: Diet NPO time specified  Neurologic Exam: General: Mental Status: Alert, oriented, thought content appropriate.  Speech fluent without evidence of aphasia.  Able to follow 3 step commands without difficulty. 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), facial light touch sensation normal bilaterally VIII: hearing normal bilaterally IX,X: uvula rises symmetrically XI: bilateral shoulder shrug XII: midline tongue extension without atrophy or fasciculations  Motor: Right : Upper extremity   5/5    Left:     Upper extremity   5/5  Lower extremity   5/5     Lower extremity   5/5 Tone and bulk:normal tone throughout; no atrophy noted Sensory: Pinprick and light touch intact throughout, bilaterally Deep Tendon Reflexes:  Right: Upper Extremity   Left: Upper extremity   biceps (C-5 to C-6) 2/4   biceps (C-5 to C-6) 2/4 tricep (C7) 2/4    triceps (C7) 2/4 Brachioradialis (C6) 2/4  Brachioradialis (C6) 2/4  Lower Extremity Lower Extremity  quadriceps  (L-2 to L-4) 2/4   quadriceps (L-2 to L-4) 2/4 Achilles (S1) 2/4   Achilles (S1) 2/4  Plantars: Right: downgoing   Left: downgoing Cerebellar: normal finger-to-nose,  normal heel-to-shin test    Lab Results: Basic Metabolic Panel:  Recent Labs Lab 08/05/14 2029 08/05/14 2035 08/06/14 0256  NA 136 136 137  K 4.4 4.3 3.8  CL 101 101 104  CO2 21  --  20  GLUCOSE 120* 127* 147*  BUN 13 14 11   CREATININE 1.04 0.80 0.92  CALCIUM 9.3  --  8.5    Liver Function Tests:  Recent Labs Lab 08/05/14 2029  AST 69*  ALT 27  ALKPHOS 95  BILITOT 3.1*  PROT 7.4  ALBUMIN 3.4*    Recent Labs Lab 08/05/14 2344  LIPASE 26    Recent Labs Lab 08/05/14 2155 08/06/14 0256  AMMONIA 86* 85*    CBC:  Recent Labs Lab 08/05/14 2029 08/05/14 2035 08/06/14 0256  WBC 7.2  --  8.6  NEUTROABS 6.0  --   --   HGB 12.8* 14.3 11.8*  HCT 38.8* 42.0 35.2*  MCV 95.1  --  94.1  PLT 45*  --  42*    Cardiac Enzymes: No results for input(s): CKTOTAL, CKMB, CKMBINDEX, TROPONINI in the last 168 hours.  Lipid Panel: No results for input(s): CHOL, TRIG, HDL, CHOLHDL, VLDL, LDLCALC in the last 168 hours.  CBG:  Recent Labs Lab 08/05/14 2056 08/06/14 0142  GLUCAP 157* 148*    Microbiology: Results for orders placed or performed  during the hospital encounter of 08/05/14  MRSA PCR Screening     Status: Abnormal   Collection Time: 08/06/14  4:00 AM  Result Value Ref Range Status   MRSA by PCR POSITIVE (A) NEGATIVE Final    Comment:        The GeneXpert MRSA Assay (FDA approved for NASAL specimens only), is one component of a comprehensive MRSA colonization surveillance program. It is not intended to diagnose MRSA infection nor to guide or monitor treatment for MRSA infections. RESULT CALLED TO, READ BACK BY AND VERIFIED WITH: J.LINDSAY,RN 08/06/14 0831 BY BSLADE     Coagulation Studies:  Recent Labs  08/05/14 2029 08/06/14 0256  LABPROT 16.8* 19.8*  INR 1.35  1.66*    Imaging: Ct Head Wo Contrast  08/05/2014   CLINICAL DATA:  Slurred speech and facial droop today. Some confusion.  EXAM: CT HEAD WITHOUT CONTRAST  TECHNIQUE: Contiguous axial images were obtained from the base of the skull through the vertex without intravenous contrast.  COMPARISON:  None.  FINDINGS: Age advanced cerebral atrophy and ventriculomegaly. No extra-axial fluid collections are identified. No CT findings for acute hemispheric infarction or intracranial hemorrhage. No mass lesions. The brainstem and cerebellum are grossly normal. Vascular calcifications are noted.  The bony structures are intact. The paranasal sinuses and mastoid air cells are clear. Patient has had prior left mastoid surgery with mastoid bowl procedure.  IMPRESSION: No CT findings for an acute intracranial process.   Electronically Signed   By: Rudie Meyer M.D.   On: 08/05/2014 20:48   Mr Brain Wo Contrast  08/05/2014   CLINICAL DATA:  55 year old male code stroke with slurred speech and facial droop. Initial encounter.  EXAM: MRI HEAD WITHOUT CONTRAST  TECHNIQUE: Multiplanar, multiecho pulse sequences of the brain and surrounding structures were obtained without intravenous contrast.  COMPARISON:  Head CT without contrast 2037 hours the same day.  FINDINGS: Noncontrast MRI imaging of the brain was limited to diffusion-weighted (axial and coronal), and axial FLAIR imaging.  No restricted diffusion or evidence of acute infarction. Patchy cerebral white matter FLAIR hyperintensity, mild to moderate for age. No cortical encephalomalacia identified. No midline shift, mass effect, or evidence of intracranial mass lesion. No ventriculomegaly.  IMPRESSION: No evidence of acute ischemia. Study discussed by telephone with Dr. Ritta Slot on 08/05/2014 at 21:44 .   Electronically Signed   By: Odessa Fleming M.D.   On: 08/05/2014 21:45   Dg Chest Port 1 View  08/06/2014   CLINICAL DATA:  Acute onset of shortness of breath.  Altered mental status. Initial encounter.  EXAM: PORTABLE CHEST - 1 VIEW  COMPARISON:  None.  FINDINGS: The lungs are well-aerated. Minimal bibasilar atelectasis is noted. There is no evidence of pleural effusion or pneumothorax.  The cardiomediastinal silhouette is within normal limits. No acute osseous abnormalities are seen. Chronic right fifth and sixth posterior rib deformities are noted.  IMPRESSION: Minimal bibasilar atelectasis noted.   Electronically Signed   By: Roanna Raider M.D.   On: 08/06/2014 00:30    Medications:  Scheduled: . Chlorhexidine Gluconate Cloth  6 each Topical Q0600  . LORazepam  1 mg Intravenous Once  . mupirocin ointment  1 application Nasal BID  . piperacillin-tazobactam (ZOSYN)  IV  3.375 g Intravenous Q8H    Assessment/Plan:  54 year old male presenting with altered mental status. While hospitalized found to have elevated ammonia and UTI. Today he is alert and oriented but still has elevated temperature.  MRI brain shows  no acute infarct. EEG pending.  Encephalopathy likely combination of hepatic and infective source.   Recommend: 1) EEG--if negative no further neurology recommendations and will S/O  Felicie Morn PA-C [facility] [phone]  I personally participated in this patient's evaluation and management, including formulating above clinical impression and management recommendations. No changes in current management recommended.  Venetia Maxon M.D. [facility] [phone]  08/06/2014, 9:56 AM
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
## 2014-08-06 NOTE — Progress Notes (Signed)
MD/N pt confused, and stated cold, maint order received, will cont to monitor

## 2014-08-06 NOTE — Progress Notes (Signed)
[id number] Np made aware of pt's Lactic acid of 4.7, Hr of 120's and temp 103.3 will continue to monitor pt.

## 2014-08-06 NOTE — Progress Notes (Addendum)
ANTIBIOTIC CONSULT NOTE - INITIAL  Pharmacy Consult for Cefepime Indication: urosepsis coverage  Not on File  Patient Measurements: Height: 5\' 7"  (170.2 cm) Weight: 187 lb 9.8 oz (85.1 kg) IBW/kg (Calculated) : 66.1   Vital Signs: Temp: 101.2 F (38.4 C) (03/22 0855) Temp Source: Rectal (03/22 0855) BP: 123/80 mmHg (03/22 1200) Pulse Rate: 105 (03/22 1200) Intake/Output from previous day: 03/21 0701 - 03/22 0700 In: 1165 [I.V.:115; IV Piggyback:1050] Out: 2075 [Urine:2075] Intake/Output from this shift: Total I/O In: 0  Out: 1000 [Urine:1000]  Labs:  Recent Labs  08/05/14 2029 08/05/14 2035 08/06/14 0256 08/06/14 0849  WBC 7.2  --  8.6  --   HGB 12.8* 14.3 11.8*  --   PLT 45*  --  42*  --   CREATININE 1.04 0.80 0.92 0.90   Estimated Creatinine Clearance: 96.7 mL/min (by C-G formula based on Cr of 0.9).  Microbiology: Recent Results (from the past 720 hour(s))  MRSA PCR Screening     Status: Abnormal   Collection Time: 08/06/14  4:00 AM  Result Value Ref Range Status   MRSA by PCR POSITIVE (A) NEGATIVE Final    Comment:        The GeneXpert MRSA Assay (FDA approved for NASAL specimens only), is one component of a comprehensive MRSA colonization surveillance program. It is not intended to diagnose MRSA infection nor to guide or monitor treatment for MRSA infections. RESULT CALLED TO, READ BACK BY AND VERIFIED WITH: J.LINDSAY,RN 08/06/14 0831 BY BSLADE     Medical History: History reviewed. No pertinent past medical history.  Assessment:  Ceftriaxone given 3/21 pm, and one dose of Zosyn given this am.   Changing to Cefepime for urosepsis coverage.  Noted hx UTI about a month ago, [facility] but [facility] unknown.  Tmax 103.3, WBC 8.2.  Hematuria. Low platelet count, NH3 85.  Goal of Therapy:  appropriate Cefepime dose for renal function and infection  Plan:   Cefepime 1 gram IV q8hrs.  Will follow renal function, platelet count, culture data,  progress.  Dennie Fettersgan, Theresa Donovan, [place] Pager: [phone] 08/06/2014,12:23 PM  Addendum: Vancomycin  1 g IV q8h added for 2/2 blood cultures positive for GPC.  Geannie RisenGreg Seryna Marek, PharmD, BCPS 08/07/2014 12:12 AM

## 2014-08-06 NOTE — Progress Notes (Signed)
  Echocardiogram 2D Echocardiogram has been performed.  Arvil ChacoFoster, Cesily Cuoco 08/06/2014, 10:39 AM

## 2014-08-07 ENCOUNTER — Inpatient Hospital Stay (HOSPITAL_COMMUNITY): Payer: Non-veteran care

## 2014-08-07 DIAGNOSIS — R569 Unspecified convulsions: Secondary | ICD-10-CM

## 2014-08-07 LAB — COMPREHENSIVE METABOLIC PANEL
ALBUMIN: 2.5 g/dL — AB (ref 3.5–5.2)
ALK PHOS: 61 U/L (ref 39–117)
ALT: 19 U/L (ref 0–53)
AST: 31 U/L (ref 0–37)
Anion gap: 6 (ref 5–15)
BUN: 9 mg/dL (ref 6–23)
CO2: 21 mmol/L (ref 19–32)
Calcium: 8.2 mg/dL — ABNORMAL LOW (ref 8.4–10.5)
Chloride: 107 mmol/L (ref 96–112)
Creatinine, Ser: 0.72 mg/dL (ref 0.50–1.35)
GFR calc Af Amer: >90 mL/min (ref >90)
GFR calc non Af Amer: >90 mL/min (ref >90)
Glucose, Bld: 166 mg/dL — ABNORMAL HIGH (ref 70–99)
POTASSIUM: 3.2 mmol/L — AB (ref 3.5–5.1)
Sodium: 134 mmol/L — ABNORMAL LOW (ref 135–145)
Total Bilirubin: 2.3 mg/dL — ABNORMAL HIGH (ref 0.3–1.2)
Total Protein: 6.1 g/dL (ref 6.0–8.3)

## 2014-08-07 LAB — CBC WITH DIFFERENTIAL/PLATELET
BASOS ABS: 0 10*3/uL (ref 0.0–0.1)
Basophils Relative: 0 % (ref 0–1)
Eosinophils Absolute: 0 10*3/uL (ref 0.0–0.7)
Eosinophils Relative: 0 % (ref 0–5)
HCT: 33.3 % — ABNORMAL LOW (ref 39.0–52.0)
Hemoglobin: 10.9 g/dL — ABNORMAL LOW (ref 13.0–17.0)
LYMPHS PCT: 11 % — AB (ref 12–46)
Lymphs Abs: 0.8 10*3/uL (ref 0.7–4.0)
MCH: 31 pg (ref 26.0–34.0)
MCHC: 32.7 g/dL (ref 30.0–36.0)
MCV: 94.6 fL (ref 78.0–100.0)
Monocytes Absolute: 0.8 10*3/uL (ref 0.1–1.0)
Monocytes Relative: 12 % (ref 3–12)
Neutro Abs: 5.1 10*3/uL (ref 1.7–7.7)
Neutrophils Relative %: 77 % (ref 43–77)
PLATELETS: 33 10*3/uL — AB (ref 150–400)
RBC: 3.52 MIL/uL — AB (ref 4.22–5.81)
RDW: 15.6 % — ABNORMAL HIGH (ref 11.5–15.5)
WBC: 6.7 10*3/uL (ref 4.0–10.5)

## 2014-08-07 LAB — CORTISOL-AM, BLOOD: CORTISOL - AM: 21 ug/dL (ref 4.3–22.4)

## 2014-08-07 LAB — GLUCOSE, CAPILLARY
GLUCOSE-CAPILLARY: 130 mg/dL — AB (ref 70–99)
Glucose-Capillary: 140 mg/dL — ABNORMAL HIGH (ref 70–99)
Glucose-Capillary: 111 mg/dL — ABNORMAL HIGH (ref 70–99)
Glucose-Capillary: 158 mg/dL — ABNORMAL HIGH (ref 70–99)

## 2014-08-07 LAB — HEMOGLOBIN: Hemoglobin: 11.6 g/dL — ABNORMAL LOW (ref 13.0–17.0)

## 2014-08-07 MED ORDER — METOPROLOL TARTRATE 25 MG PO TABS
25.0000 mg | ORAL_TABLET | Freq: Two times a day (BID) | ORAL | Status: DC
  Administered 2014-08-07 – 2014-08-14 (×14): 25 mg via ORAL
  Filled 2014-08-07 (×15): qty 1

## 2014-08-07 MED ORDER — MAGNESIUM SULFATE 2 GM/50ML IV SOLN
2.0000 g | Freq: Once | INTRAVENOUS | Status: AC
  Administered 2014-08-07: 2 g via INTRAVENOUS
  Filled 2014-08-07: qty 50

## 2014-08-07 MED ORDER — ACETAMINOPHEN 650 MG RE SUPP: 325.0000 mg | Freq: Four times a day (QID) | RECTAL | Status: DC | PRN

## 2014-08-07 MED ORDER — POTASSIUM CHLORIDE CRYS ER 20 MEQ PO TBCR
40.0000 meq | EXTENDED_RELEASE_TABLET | Freq: Two times a day (BID) | ORAL | Status: AC
  Administered 2014-08-07 – 2014-08-08 (×2): 40 meq via ORAL
  Filled 2014-08-07 (×2): qty 2

## 2014-08-07 MED ORDER — VANCOMYCIN HCL IN DEXTROSE 1-5 GM/200ML-% IV SOLN
1000.0000 mg | Freq: Three times a day (TID) | INTRAVENOUS | Status: DC
  Administered 2014-08-07 – 2014-08-08 (×5): 1000 mg via INTRAVENOUS
  Filled 2014-08-07 (×6): qty 200

## 2014-08-07 MED ORDER — ACETAMINOPHEN 325 MG PO TABS
325.0000 mg | ORAL_TABLET | Freq: Four times a day (QID) | ORAL | Status: DC | PRN
  Administered 2014-08-08 – 2014-08-11 (×7): 325 mg via ORAL
  Filled 2014-08-07 (×8): qty 1

## 2014-08-07 MED ORDER — LACTULOSE 10 GM/15ML PO SOLN
20.0000 g | Freq: Two times a day (BID) | ORAL | Status: DC
  Administered 2014-08-07 – 2014-08-10 (×6): 20 g via ORAL
  Filled 2014-08-07 (×7): qty 30

## 2014-08-07 MED ORDER — TERAZOSIN HCL 2 MG PO CAPS
4.0000 mg | ORAL_CAPSULE | Freq: Every day | ORAL | Status: DC
  Administered 2014-08-07 – 2014-08-13 (×7): 4 mg via ORAL
  Filled 2014-08-07 (×8): qty 2

## 2014-08-07 NOTE — Progress Notes (Signed)
[place] TEAM 1 - Stepdown/ICU TEAM Progress Note  John AlfredRory Santiago WUJ:[id number] DOB: 07/22/1960 DOA: 08/05/2014 PCP: No primary care provider on file.  Admit HPI / Brief Narrative: 54 y.o. Male with no significant past medical history who presented with altered mental status. He was driving and stopped and told his wife he could not drive any more. The patient was brought to the emergency room and code stroke was called. Patient was evaluated by Neurology and had negative CT and MRI of the head. Patient was still confused and could not give a history. Patient had hematuria while he was in the emergency room and reported being [facility] for a urinary tract infection around [facility] prior [facility].  His ammonia level came back elevated although he's not an alcoholic according to the family. His lactic acid level was over 5.  HPI/Subjective: Pt is lethargic.  He does not have focal complaints, and states he is feeling "a little bit better."  Assessment/Plan:  Staph aureus Urosepsis w/ 2/2 bacteremia  -Patient was [facility] within the last 30 days [facility]; patient does not recall which antibiotic he was given -narrow abx to [place] only and await speciation - Staph sepsis should trigger an automatic ID consult  -no vegetation noted on TTE - TEE may be required   Sinus tachycardia  -persists - ?due to sepsis - could be indicative of cardiac involvement - follow   Recurrent urinary tract infection - L non obstructing renal stone  -See urosepsis - will need prolonged course of abx due to presence of stone   Hematuria -likely secondary to urosepsis and stone - follow but may require further urologic w/u    Elevated ammonia -Normal liver echogenicity on US - concerning for possible EtOH abuse   Thrombocytopenia  -concerning for possible EtOH abuse - follow  Hypomagnesemia -replace and follow   Hypokalemia  -replace and follow   Hyponatremia  -concerning for  possible EtOH abuse - follow  Code Status: FULL Family Communication: no family present at time of exam Disposition Plan: SDU  Consultants: Neurology  Procedure/Significant Events: 3/22 echocardiogram- Left ventricle: mild LVH.LVEF= 55% to 60%. 3/22 abdominal ultrasound; Gallbladder: Contracted in nature. Wall thickening is noted - A minimal amount of pericholecystic fluid is noted. No gallstones are seen.  Antibiotics: Ceftriaxone 3/21 Zosyn 3/21 Cefepime 3/22 > 3/23 Vanc 3/22 >   DVT prophylaxis: SCD  Objective: Blood pressure 148/86, pulse 104, temperature 99.9 F (37.7 C), temperature source Oral, resp. rate 20, height 5\' 7"  (1.702 m), weight 85.1 kg (187 lb 9.8 oz), SpO2 96 %.  Intake/Output Summary (Last 24 hours) at 08/07/14 1358 Last data filed at 08/07/14 1147  Gross per 24 hour  Intake 3947.08 ml  Output   3150 ml  Net 797.08 ml   Exam: General: No acute respiratory distress Lungs: Clear to auscultation bilaterally without wheezes or crackles Cardiovascular: Regular rate and rhythm without murmur gallop or rub  Abdomen: Nontender, nondistended, soft, bowel sounds positive, no rebound, no ascites, no appreciable mass Extremities: No significant cyanosis, clubbing, or edema bilateral lower extremities  Data Reviewed: Basic Metabolic Panel:  Recent Labs Lab 08/05/14 2029 08/05/14 2035 08/06/14 0256 08/06/14 0849 08/07/14 0236  NA 136 136 137 137 134*  K 4.4 4.3 3.8 3.5 3.2*  CL 101 101 104 105 107  CO2 21  --  20 22 21   GLUCOSE 120* 127* 147* 146* 166*  BUN 13 14 11 10  9  CREATININE 1.04 0.80 0.92 0.90 0.72  CALCIUM 9.3  --  8.5 8.2* 8.2*  MG  --   --   --  1.4*  --    Liver Function Tests:  Recent Labs Lab 08/05/14 2029 08/06/14 0849 08/07/14 0236  AST 69* 40* 31  ALT ALKPHOS 95 70 61  BILITOT 3.1* 2.1* 2.3*  PROT 7.4 6.1 6.1  ALBUMIN 3.4* 2.7* 2.5*    Recent Labs Lab 08/05/14 2344  LIPASE 26    Recent Labs Lab  08/05/14 2155 08/06/14 0256  AMMONIA 86* 85*   CBC:  Recent Labs Lab 08/05/14 2029 08/05/14 2035 08/06/14 0256 08/06/14 1927 08/07/14 0236  WBC 7.2  --  8.6  --  6.7  NEUTROABS 6.0  --   --   --  5.1  HGB 12.8* 14.3 11.8* 11.2* 10.9*  HCT 38.8* 42.0 35.2*  --  33.3*  MCV 95.1  --  94.1  --  94.6  PLT 45*  --  42*  --  33*   CBG:  Recent Labs Lab 08/06/14 1233 08/06/14 1544 08/06/14 2124 08/07/14 0901 08/07/14 1128  GLUCAP 133* 153* 125* 140* 130*    Recent Results (from the past 240 hour(s))  Urine culture     Status: None (Preliminary result)   Collection Time: 08/05/14 10:11 PM  Result Value Ref Range Status   Specimen Description URINE, CLEAN CATCH  Final   Special Requests ADDED 0004 08/06/14  Final   Colony Count   Final    >=100,000 COLONIES/ML Performed at [facility]    Culture   Final    STAPHYLOCOCCUS AUREUS Note: RIFAMPIN AND GENTAMICIN SHOULD NOT BE USED AS SINGLE DRUGS FOR TREATMENT OF STAPH INFECTIONS. Performed at [facility]    Report Status PENDING  Incomplete  Blood Culture (routine x 2)     Status: None (Preliminary result)   Collection Time: 08/05/14 11:52 PM  Result Value Ref Range Status   Specimen Description BLOOD LEFT ANTECUBITAL  Final   Special Requests BOTTLES DRAWN AEROBIC AND ANAEROBIC 5CC EA  Final   Culture   Final    GRAM POSITIVE COCCI IN CLUSTERS Note: Gram Stain Report Called to,Read Back By and Verified With: HEATHER RICHARD 08/06/14 AT 2355 RIDK Performed at [facility]    Report Status PENDING  Incomplete  Blood Culture (routine x 2)     Status: None (Preliminary result)   Collection Time: 08/05/14 11:55 PM  Result Value Ref Range Status   Specimen Description BLOOD LEFT WRIST  Final   Special Requests BOTTLES DRAWN AEROBIC AND ANAEROBIC 5CC EA  Final   Culture   Final    GRAM POSITIVE COCCI IN CLUSTERS Note: Gram Stain Report Called to,Read Back By and Verified With: HEATHER RICHARD  07/26/14 AT 2355 RIDK Performed at [facility]    Report Status PENDING  Incomplete  MRSA PCR Screening     Status: Abnormal   Collection Time: 08/06/14  4:00 AM  Result Value Ref Range Status   MRSA by PCR POSITIVE (A) NEGATIVE Final    Comment:        The GeneXpert MRSA Assay (FDA approved for NASAL specimens only), is one component of a comprehensive MRSA colonization surveillance program. It is not intended to diagnose MRSA infection nor to guide or monitor treatment for MRSA infections. RESULT CALLED TO, READ BACK BY AND VERIFIED WITH: J.LINDSAY,RN 08/06/14 0831 BY BSLADE  Studies:  Recent x-ray studies have been reviewed in detail by the Attending Physician  Scheduled Meds:  Scheduled Meds: . antiseptic oral rinse  7 mL Mouth Rinse q12n4p  . ceFEPime (MAXIPIME) IV  1 g Intravenous Q8H  . chlorhexidine  15 mL Mouth Rinse BID  . Chlorhexidine Gluconate Cloth  6 each Topical Q0600  . Influenza vac split quadrivalent PF  0.5 mL Intramuscular Tomorrow-1000  . LORazepam  1 mg Intravenous Once  . mupirocin ointment  1 application Nasal BID  . vancomycin  1,000 mg Intravenous 3 times per day    Time spent on care of this patient: 35 mins  Lonia Blood, MD [facility] For Consults/Admissions - Flow Manager - [phone] Office  [phone]  Contact MD directly via text page:      [email or web address]      password [facility]  08/07/2014, 1:58 PM   LOS: 1 day

## 2014-08-07 NOTE — Procedures (Signed)
EEG report.  Brief clinical history: 54 year old male presenting with altered mental status. While hospitalized found to have elevated ammonia and UTI. Today he is alert and oriented but still has elevated temperature. MRI brain shows no acute infarct  Technique: this is a 17 channel routine scalp EEG performed at the bedside with bipolar and monopolar montages arranged in accordance to the international 10/20 system of electrode placement. One channel was dedicated to EKG recording.  The study was performed during wakefulness, drowsiness, and stage 2 sleep. Hyperventilation and intermittent photic stimulation were utilized as activating procedures.  Description: as the study begins, patient falls rapidly into sleep but subsequently gets into the wakeful state and achieves a best background that consisted of a medium amplitude, posterior dominant, well sustained, symmetric and reactive 8 Hz rhythm.ns. Stage 2 sleep showed symmetric and synchronous sleep spindles without intermixed epileptiform discharges. Hyperventilation induced mild, diffuse, physiologic slowing but no epileptiform discharges. Intermittent photic stimulation did not induce a driving response.  No focal or generalized epileptiform discharges noted.  No pathologic areas of slowing seen.  EKG showed sinus rhythm.  Impression: this is a normal awake and asleep EEG. Please, be aware that a normal EEG does not exclude the possibility of epilepsy.  Clinical correlation is advised.   Wyatt Portelasvaldo Karlon Schlafer, MD [facility]

## 2014-08-07 NOTE — Progress Notes (Signed)
EEG Completed; Results Pending  

## 2014-08-08 ENCOUNTER — Encounter (HOSPITAL_COMMUNITY): Payer: Self-pay | Admitting: Internal Medicine

## 2014-08-08 DIAGNOSIS — F17210 Nicotine dependence, cigarettes, uncomplicated: Secondary | ICD-10-CM | POA: Diagnosis present

## 2014-08-08 DIAGNOSIS — E785 Hyperlipidemia, unspecified: Secondary | ICD-10-CM

## 2014-08-08 DIAGNOSIS — R7881 Bacteremia: Secondary | ICD-10-CM

## 2014-08-08 DIAGNOSIS — F329 Major depressive disorder, single episode, unspecified: Secondary | ICD-10-CM

## 2014-08-08 DIAGNOSIS — Z720 Tobacco use: Secondary | ICD-10-CM

## 2014-08-08 DIAGNOSIS — E119 Type 2 diabetes mellitus without complications: Secondary | ICD-10-CM

## 2014-08-08 DIAGNOSIS — B9562 Methicillin resistant Staphylococcus aureus infection as the cause of diseases classified elsewhere: Secondary | ICD-10-CM

## 2014-08-08 DIAGNOSIS — F32A Depression, unspecified: Secondary | ICD-10-CM | POA: Diagnosis present

## 2014-08-08 DIAGNOSIS — E871 Hypo-osmolality and hyponatremia: Secondary | ICD-10-CM

## 2014-08-08 DIAGNOSIS — N200 Calculus of kidney: Secondary | ICD-10-CM | POA: Diagnosis present

## 2014-08-08 DIAGNOSIS — N390 Urinary tract infection, site not specified: Secondary | ICD-10-CM | POA: Diagnosis present

## 2014-08-08 DIAGNOSIS — D696 Thrombocytopenia, unspecified: Secondary | ICD-10-CM | POA: Diagnosis present

## 2014-08-08 DIAGNOSIS — E876 Hypokalemia: Secondary | ICD-10-CM | POA: Diagnosis present

## 2014-08-08 DIAGNOSIS — D649 Anemia, unspecified: Secondary | ICD-10-CM | POA: Diagnosis present

## 2014-08-08 DIAGNOSIS — I10 Essential (primary) hypertension: Secondary | ICD-10-CM

## 2014-08-08 DIAGNOSIS — D509 Iron deficiency anemia, unspecified: Secondary | ICD-10-CM

## 2014-08-08 LAB — CBC WITH DIFFERENTIAL/PLATELET
Basophils Absolute: 0 10*3/uL (ref 0.0–0.1)
Basophils Relative: 0 % (ref 0–1)
EOS ABS: 0.1 10*3/uL (ref 0.0–0.7)
EOS PCT: 2 % (ref 0–5)
HCT: 32.7 % — ABNORMAL LOW (ref 39.0–52.0)
Hemoglobin: 10.9 g/dL — ABNORMAL LOW (ref 13.0–17.0)
Lymphocytes Relative: 16 % (ref 12–46)
Lymphs Abs: 0.8 10*3/uL (ref 0.7–4.0)
MCH: 31.1 pg (ref 26.0–34.0)
MCHC: 33.3 g/dL (ref 30.0–36.0)
MCV: 93.2 fL (ref 78.0–100.0)
MONO ABS: 0.8 10*3/uL (ref 0.1–1.0)
MONOS PCT: 17 % — AB (ref 3–12)
NEUTROS PCT: 65 % (ref 43–77)
Neutro Abs: 3.1 10*3/uL (ref 1.7–7.7)
PLATELETS: 39 10*3/uL — AB (ref 150–400)
RBC: 3.51 MIL/uL — ABNORMAL LOW (ref 4.22–5.81)
RDW: 15.5 % (ref 11.5–15.5)
WBC: 4.8 10*3/uL (ref 4.0–10.5)

## 2014-08-08 LAB — COMPREHENSIVE METABOLIC PANEL
ALT: 18 U/L (ref 0–53)
AST: 33 U/L (ref 0–37)
Albumin: 2.4 g/dL — ABNORMAL LOW (ref 3.5–5.2)
Alkaline Phosphatase: 59 U/L (ref 39–117)
Anion gap: 6 (ref 5–15)
BILIRUBIN TOTAL: 2.2 mg/dL — AB (ref 0.3–1.2)
BUN: 9 mg/dL (ref 6–23)
CHLORIDE: 105 mmol/L (ref 96–112)
CO2: 21 mmol/L (ref 19–32)
Calcium: 7.9 mg/dL — ABNORMAL LOW (ref 8.4–10.5)
Creatinine, Ser: 0.65 mg/dL (ref 0.50–1.35)
GFR calc Af Amer: >90 mL/min (ref >90)
Glucose, Bld: 106 mg/dL — ABNORMAL HIGH (ref 70–99)
Potassium: 3.3 mmol/L — ABNORMAL LOW (ref 3.5–5.1)
Sodium: 132 mmol/L — ABNORMAL LOW (ref 135–145)
Total Protein: 6.1 g/dL (ref 6.0–8.3)

## 2014-08-08 LAB — HEPATITIS PANEL, ACUTE
HCV Ab: NEGATIVE
HEP A IGM: NONREACTIVE
HEP B S AG: NEGATIVE
Hep B C IgM: NONREACTIVE

## 2014-08-08 LAB — GLUCOSE, CAPILLARY
GLUCOSE-CAPILLARY: 115 mg/dL — AB (ref 70–99)
GLUCOSE-CAPILLARY: 108 mg/dL — AB (ref 70–99)
Glucose-Capillary: 195 mg/dL — ABNORMAL HIGH (ref 70–99)
Glucose-Capillary: 167 mg/dL — ABNORMAL HIGH (ref 70–99)

## 2014-08-08 LAB — URINE CULTURE

## 2014-08-08 LAB — AMMONIA: AMMONIA: 75 umol/L — AB (ref 11–32)

## 2014-08-08 LAB — VANCOMYCIN, TROUGH: VANCOMYCIN TR: 13.7 ug/mL (ref 10.0–20.0)

## 2014-08-08 LAB — PHOSPHORUS: Phosphorus: 1.8 mg/dL — ABNORMAL LOW (ref 2.3–4.6)

## 2014-08-08 LAB — MAGNESIUM: MAGNESIUM: 2 mg/dL (ref 1.5–2.5)

## 2014-08-08 MED ORDER — POTASSIUM CHLORIDE CRYS ER 20 MEQ PO TBCR
40.0000 meq | EXTENDED_RELEASE_TABLET | Freq: Two times a day (BID) | ORAL | Status: AC
  Administered 2014-08-08 – 2014-08-09 (×2): 40 meq via ORAL
  Filled 2014-08-08 (×2): qty 2

## 2014-08-08 MED ORDER — POTASSIUM CHLORIDE CRYS ER 20 MEQ PO TBCR: 40.0000 meq | EXTENDED_RELEASE_TABLET | Freq: Two times a day (BID) | ORAL | Status: DC

## 2014-08-08 MED ORDER — VANCOMYCIN HCL 10 G IV SOLR
1250.0000 mg | Freq: Three times a day (TID) | INTRAVENOUS | Status: DC
  Administered 2014-08-08 – 2014-08-12 (×12): 1250 mg via INTRAVENOUS
  Filled 2014-08-08 (×16): qty 1250

## 2014-08-08 NOTE — Progress Notes (Signed)
[place] TEAM 1 - Stepdown/ICU TEAM Progress Note  John Santiago EAV:[id number] DOB: [birth year] DOA: 08/05/2014 PCP: No primary care provider on file.  Admit HPI / Brief Narrative: John Santiago is a 54 y.o. male, with no significant past medical history presenting with altered mental status. He was driving and stopped and told his wife he cannot drive anymore. The patient was brought to the emergency room and code stroke was called. Patient was evaluated by neurology, had a negative CT and MRI of the head. Patient is still confused and cannot give significant history. Denies any chest pains, shortness of breath abdominal pain but had an episode of vomiting. Patient reports drinking alcohol today but his alcohol level was less than 5. Patient had hematuria while he was in the emergency room and reports being treated for a urinary tract infection around one month ago at the [facility]. No history of trauma. His ammonia level came back elevated although he's not a chronic alcoholic according to the family. His lactic acid level was over 5.   HPI/Subjective: 3/24 A/O 4 states feels significantly improved from the last time I evaluated him.    Assessment/Plan: MRSA Urosepsis w/ 2/2 bacteremia  -Patient was [facility] within the last 30 days [facility]; patient does not recall which to antibiotics he was given.  -Continue normal saline to 1 ml/hr -Continue vancomycin per ID; patient understands well the PICC line when blood cultures return clear -Echocardiogram; normal see results below -Patient will require a TEE to evaluate his valves.  Recurrent urinary tract infection- L non obstructing renal stone  -See urosepsis  Hematuria -Monitor most likely secondary to urosepsis, if does not clear with clearance of infection will require further workup by urology   Elevated ammonia -Normal liver echogenicity on US - concerning for possible EtOH abuse  -Will check ammonia level in  a.m.  Thrombocytopenia  -concerning for possible EtOH abuse - follow  Hypomagnesemia -Resolved continue to follow    Hypokalemia  -K-Dur 40 mEq 2 doses    Hyponatremia  -concerning for possible EtOH abuse - follow -See urosepsis  Code Status: FULL Family Communication: no family present at time of exam Disposition Plan: Resolution sepsis    Consultants: NA  Procedure/Significant Events: 3/22 echocardiogram;- Left ventricle: mild LVH.LVEF= 55% to 60%. 3/22 abdominal ultrasound; Gallbladder: Contracted in nature. Wall thickening is noted  - A minimal amount of pericholecystic fluid is noted. No gallstones are seen.   Culture 3/21 urine positive MRSA 3/21 blood Lt Antecubital/wrist positive MRSA 3/22 MRSA by PCR positive   Antibiotics: Ceftriaxone 3/21 1 dose Zosyn 3/21>> stopped 3/22 Cefepime 3/22 > stopped 3/23 Vanc 3/22 >    DVT prophylaxis: SCD   Devices NA   LINES / TUBES:      Continuous Infusions: . sodium chloride 75 mL/hr at 08/07/14 2040    Objective: VITAL SIGNS: Temp: 99.3 F (37.4 C) (03/24 1500) Temp Source: Oral (03/24 1500) BP: 121/76 mmHg (03/24 1054) Pulse Rate: 106 (03/24 1054) SPO2; FIO2:   Intake/Output Summary (Last 24 hours) at 08/08/14 1614 Last data filed at 08/08/14 1500  Gross per 24 hour  Intake   1860 ml  Output   3700 ml  Net  -1840 ml     Exam: General: A/O 4, NAD, sitting in chair comfortably,  No acute respiratory distress Lungs: Clear to auscultation bilaterally without wheezes or crackles Cardiovascular: Regular rate and rhythm without murmur gallop or rub normal S1 and S2 Abdomen:  Nontender, nondistended, soft, bowel sounds positive, no rebound, no ascites, no appreciable mass,Negative CVA tenderness  Extremities: No significant cyanosis, clubbing, or edema bilateral lower extremities  Data Reviewed: Basic Metabolic Panel:  Recent Labs Lab 08/05/14 2029 08/05/14 2035 08/06/14 0256  08/06/14 0849 08/07/14 0236 08/08/14 0554  NA 136 136 137 137 134* 132*  K 4.4 4.3 3.8 3.5 3.2* 3.3*  CL 101 101 104 105 107 105  CO2 21  --  GLUCOSE 120* 127* 147* 146* 166* 106*  BUN CREATININE 1.04 0.80 0.92 0.90 0.72 0.65  CALCIUM 9.3  --  8.5 8.2* 8.2* 7.9*  MG  --   --   --  1.4*  --  2.0  PHOS  --   --   --   --   --  1.8*   Liver Function Tests:  Recent Labs Lab 08/05/14 2029 08/06/14 0849 08/07/14 0236 08/08/14 0554  AST 69* 40* 31 33  ALT ALKPHOS 95 70 61 59  BILITOT 3.1* 2.1* 2.3* 2.2*  PROT 7.4 6.1 6.1 6.1  ALBUMIN 3.4* 2.7* 2.5* 2.4*    Recent Labs Lab 08/05/14 2344  LIPASE 26    Recent Labs Lab 08/05/14 2155 08/06/14 0256 08/08/14 0554  AMMONIA 86* 85* 75*   CBC:  Recent Labs Lab 08/05/14 2029 08/05/14 2035 08/06/14 0256 08/06/14 1927 08/07/14 0236 08/07/14 1700 08/08/14 0554  WBC 7.2  --  8.6  --  6.7  --  4.8  NEUTROABS 6.0  --   --   --  5.1  --  3.1  HGB 12.8* 14.3 11.8* 11.2* 10.9* 11.6* 10.9*  HCT 38.8* 42.0 35.2*  --  33.3*  --  32.7*  MCV 95.1  --  94.1  --  94.6  --  93.2  PLT 45*  --  42*  --  33*  --  39*   Cardiac Enzymes: No results for input(s): CKTOTAL, CKMB, CKMBINDEX, TROPONINI in the last 168 hours. BNP (last 3 results) No results for input(s): BNP in the last 8760 hours.  ProBNP (last 3 results) No results for input(s): PROBNP in the last 8760 hours.  CBG:  Recent Labs Lab 08/07/14 1128 08/07/14 1609 08/07/14 2125 08/08/14 0811 08/08/14 1106  GLUCAP 130* 111* 158* 115* 195*    Recent Results (from the past 240 hour(s))  Urine culture     Status: None   Collection Time: 08/05/14 10:11 PM  Result Value Ref Range Status   Specimen Description URINE, CLEAN CATCH  Final   Special Requests ADDED 0004 08/06/14  Final   Colony Count   Final    >=100,000 COLONIES/ML Performed at [facility]    Culture   Final    METHICILLIN RESISTANT STAPHYLOCOCCUS  AUREUS Note: RIFAMPIN AND GENTAMICIN SHOULD NOT BE USED AS SINGLE DRUGS FOR TREATMENT OF STAPH INFECTIONS. CRITICAL RESULT CALLED TO, READ BACK BY AND VERIFIED WITH: LEE WORLEY AT 3:04 A.M. ON 08/08/2014 WARRB Performed at [facility]    Report Status 08/08/2014 FINAL  Final   Organism ID, Bacteria METHICILLIN RESISTANT STAPHYLOCOCCUS AUREUS  Final      Susceptibility   Methicillin resistant staphylococcus aureus - MIC*    GENTAMICIN <=0.5 SENSITIVE Sensitive     LEVOFLOXACIN 4 INTERMEDIATE Intermediate     NITROFURANTOIN <=16 SENSITIVE Sensitive     OXACILLIN >=4 RESISTANT Resistant     PENICILLIN >=0.5 RESISTANT Resistant  RIFAMPIN <=0.5 SENSITIVE Sensitive     TRIMETH/SULFA <=10 SENSITIVE Sensitive     VANCOMYCIN 1 SENSITIVE Sensitive     TETRACYCLINE <=1 SENSITIVE Sensitive     * METHICILLIN RESISTANT STAPHYLOCOCCUS AUREUS  Blood Culture (routine x 2)     Status: None (Preliminary result)   Collection Time: 08/05/14 11:52 PM  Result Value Ref Range Status   Specimen Description BLOOD LEFT ANTECUBITAL  Final   Special Requests BOTTLES DRAWN AEROBIC AND ANAEROBIC 5CC EA  Final   Culture   Final    STAPHYLOCOCCUS AUREUS Note: RIFAMPIN AND GENTAMICIN SHOULD NOT BE USED AS SINGLE DRUGS FOR TREATMENT OF STAPH INFECTIONS. SUSCEPTIBILITIES PERFORMED ON PREVIOUS CULTURE WITHIN THE LAST 5 DAYS. Note: Gram Stain Report Called to,Read Back By and Verified With: HEATHER RICHARD 08/06/14 AT 2355 RIDK Performed at [facility]    Report Status PENDING  Incomplete  Blood Culture (routine x 2)     Status: None (Preliminary result)   Collection Time: 08/05/14 11:55 PM  Result Value Ref Range Status   Specimen Description BLOOD LEFT WRIST  Final   Special Requests BOTTLES DRAWN AEROBIC AND ANAEROBIC 5CC EA  Final   Culture   Final    METHICILLIN RESISTANT STAPHYLOCOCCUS AUREUS Note: RIFAMPIN AND GENTAMICIN SHOULD NOT BE USED AS SINGLE DRUGS FOR TREATMENT OF STAPH INFECTIONS.  CRITICAL RESULT CALLED TO, READ BACK BY AND VERIFIED WITH: CAROL SCHILLER @ 3:28 PM 08/08/14 BY DWEEKS Note: Gram Stain Report Called to,Read Back By and Verified With: HEATHER RICHARD 07/26/14 AT 2355 RIDK Performed at [facility]    Report Status PENDING  Incomplete  MRSA PCR Screening     Status: Abnormal   Collection Time: 08/06/14  4:00 AM  Result Value Ref Range Status   MRSA by PCR POSITIVE (A) NEGATIVE Final    Comment:        The GeneXpert MRSA Assay (FDA approved for NASAL specimens only), is one component of a comprehensive MRSA colonization surveillance program. It is not intended to diagnose MRSA infection nor to guide or monitor treatment for MRSA infections. RESULT CALLED TO, READ BACK BY AND VERIFIED WITH: J.LINDSAY,RN 08/06/14 0831 BY BSLADE      Studies:  Recent x-ray studies have been reviewed in detail by the Attending Physician  Scheduled Meds:  Scheduled Meds: . antiseptic oral rinse  7 mL Mouth Rinse q12n4p  . chlorhexidine  15 mL Mouth Rinse BID  . Chlorhexidine Gluconate Cloth  6 each Topical Q0600  . lactulose  20 g Oral BID  . metoprolol tartrate  25 mg Oral BID  . mupirocin ointment  1 application Nasal BID  . terazosin  4 mg Oral QHS  . vancomycin  1,250 mg Intravenous 3 times per day    Time spent on care of this patient: 40 mins   WOODS, Roselind Messier , MD  [facility] Office  [phone] Pager - [phone]  On-Call/Text Page:      [email or web address]      password [email or web address]  If 7PM-7AM, please contact night-coverage [email or web address] Password [email or web address] 08/08/2014, 4:14 PM   LOS: 2 days   Care during the described time interval was provided by me .  I have reviewed this patient's available data, including medical history, events of note, physical examination, radiology studies and test results as part of my evaluation  Carolyne Littles, MD [phone] Pager

## 2014-08-08 NOTE — Consult Note (Signed)
[facility]    Date of Admission:  08/05/2014           Day 3 vancomycin       Reason for Consult: automatic consultation for staph aureus bacteremia    [facility]  Principal Problem:   Bacteremia due to methicillin resistant Staphylococcus aureus Active Problems:   Sepsis   Altered mental status   UTI (lower urinary tract infection)   Hematuria   Thrombocytopenia   Cigarette smoker   Normocytic anemia   Depression   Diabetes mellitus without complication   Hyperlipidemia   Hypertension   . antiseptic oral rinse  7 mL Mouth Rinse q12n4p  . chlorhexidine  15 mL Mouth Rinse BID  . Chlorhexidine Gluconate Cloth  6 each Topical Q0600  . lactulose  20 g Oral BID  . metoprolol tartrate  25 mg Oral BID  . mupirocin ointment  1 application Nasal BID  . terazosin  4 mg Oral QHS  . vancomycin  1,250 mg Intravenous 3 times per day    Recommendations: 1. Continue vancomycin 2. Repeat blood cultures 3. Recommend TEE   Assessment: He has MRSA bacteremia complicating probable left pyelonephritis related to a nonobstructing kidney stone. He is improving on vancomycin. I will repeat blood cultures today and hold off on PICC placement until blood cultures are negative. He needs a transesophageal echocardiogram to help determine whether or not he has endocarditis which will impact the duration of therapy.    HPI: John Santiago is a 54 y.o. male whose had intermittent painless hematuria over the past several months. [facility] and treated for possible UTI with 10 days of some oral antibiotic. His hematuria seemed to get a little bit better after that time. Otherwise he felt like he was in his usual state of health until 08/05/2014. He was sitting in his car with his wife waiting for his son to get off of work. He suddenly developed severe nausea and vomiting with some confusion  and was brought to the hospital. He was febrile. His urinalysis revealed hematuria and pyuria. Urine and blood cultures are growing MRSA. He does not recall much else from around the time of admission and no family members are present. He is feeling much better now.   Review of Systems: Review of systems not obtained due to patient factors.  Past Medical History  Diagnosis Date  . Depression   . Diabetes mellitus without complication   . Hyperlipidemia   . Hypertension   . Erectile dysfunction   . Fracture of right lower leg     History  Substance Use Topics  . Smoking status: Current Every Day Smoker  . Smokeless tobacco: Not on file  . Alcohol Use: Not on file    History reviewed. No pertinent family history. No Known Allergies  OBJECTIVE: Blood pressure 121/76, pulse 106, temperature 98.3 F (36.8 C), temperature source Oral, resp. rate 23, height 5\' 7"  (1.702 m), weight 187 lb 9.8 oz (85.1 kg), SpO2 96 %. General: he is sitting up in a chair Skin: no splinter or conjunctival hemorrhages Lymph nodes: No palpable adenopathy Oral: No oropharyngeal lesions. Some broken and missing teeth. Lungs: clear Cor: distant but regular S1 and S2. No murmur heard Abdomen: obese, soft and nontender. I can palpate a smooth liver edge is just at the costal margin on deep  inspiration. I do not palpate his spleen or any other masses Joints and extremities: Normal  Lab Results Lab Results  Component Value Date   WBC 4.8 08/08/2014   HGB 10.9* 08/08/2014   HCT 32.7* 08/08/2014   MCV 93.2 08/08/2014   PLT 39* 08/08/2014    Lab Results  Component Value Date   CREATININE 0.65 08/08/2014   BUN 9 08/08/2014   NA 132* 08/08/2014   K 3.3* 08/08/2014   CL 105 08/08/2014   CO2 21 08/08/2014    Lab Results  Component Value Date   ALT 18 08/08/2014   AST 33 08/08/2014   ALKPHOS 59 08/08/2014   BILITOT 2.2* 08/08/2014     Microbiology: Recent Results (from the past 240 hour(s))    Urine culture     Status: None   Collection Time: 08/05/14 10:11 PM  Result Value Ref Range Status   Specimen Description URINE, CLEAN CATCH  Final   Special Requests ADDED 0004 08/06/14  Final   Colony Count   Final    >=100,000 COLONIES/ML Performed at [facility]    Culture   Final    METHICILLIN RESISTANT STAPHYLOCOCCUS AUREUS Note: RIFAMPIN AND GENTAMICIN SHOULD NOT BE USED AS SINGLE DRUGS FOR TREATMENT OF STAPH INFECTIONS. CRITICAL RESULT CALLED TO, READ BACK BY AND VERIFIED WITH: LEE WORLEY AT 3:04 A.M. ON 08/08/2014 WARRB Performed at [facility]    Report Status 08/08/2014 FINAL  Final   Organism ID, Bacteria METHICILLIN RESISTANT STAPHYLOCOCCUS AUREUS  Final      Susceptibility   Methicillin resistant staphylococcus aureus - MIC*    GENTAMICIN <=0.5 SENSITIVE Sensitive     LEVOFLOXACIN 4 INTERMEDIATE Intermediate     NITROFURANTOIN <=16 SENSITIVE Sensitive     OXACILLIN >=4 RESISTANT Resistant     PENICILLIN >=0.5 RESISTANT Resistant     RIFAMPIN <=0.5 SENSITIVE Sensitive     TRIMETH/SULFA <=10 SENSITIVE Sensitive     VANCOMYCIN 1 SENSITIVE Sensitive     TETRACYCLINE <=1 SENSITIVE Sensitive     * METHICILLIN RESISTANT STAPHYLOCOCCUS AUREUS  Blood Culture (routine x 2)     Status: None (Preliminary result)   Collection Time: 08/05/14 11:52 PM  Result Value Ref Range Status   Specimen Description BLOOD LEFT ANTECUBITAL  Final   Special Requests BOTTLES DRAWN AEROBIC AND ANAEROBIC 5CC EA  Final   Culture   Final    STAPHYLOCOCCUS AUREUS Note: RIFAMPIN AND GENTAMICIN SHOULD NOT BE USED AS SINGLE DRUGS FOR TREATMENT OF STAPH INFECTIONS. SUSCEPTIBILITIES PERFORMED ON PREVIOUS CULTURE WITHIN THE LAST 5 DAYS. Note: Gram Stain Report Called to,Read Back By and Verified With: HEATHER RICHARD 08/06/14 AT 2355 RIDK Performed at [facility]    Report Status PENDING  Incomplete  Blood Culture (routine x 2)     Status: None (Preliminary result)    Collection Time: 08/05/14 11:55 PM  Result Value Ref Range Status   Specimen Description BLOOD LEFT WRIST  Final   Special Requests BOTTLES DRAWN AEROBIC AND ANAEROBIC 5CC EA  Final   Culture   Final    STAPHYLOCOCCUS AUREUS Note: RIFAMPIN AND GENTAMICIN SHOULD NOT BE USED AS SINGLE DRUGS FOR TREATMENT OF STAPH INFECTIONS. Note: Gram Stain Report Called to,Read Back By and Verified With: HEATHER RICHARD 07/26/14 AT 2355 RIDK Performed at [facility]    Report Status PENDING  Incomplete  MRSA PCR Screening     Status: Abnormal   Collection Time: 08/06/14  4:00 AM  Result Value Ref  Range Status   MRSA by PCR POSITIVE (A) NEGATIVE Final    Comment:        The GeneXpert MRSA Assay (FDA approved for NASAL specimens only), is one component of a comprehensive MRSA colonization surveillance program. It is not intended to diagnose MRSA infection nor to guide or monitor treatment for MRSA infections. RESULT CALLED TO, READ BACK BY AND VERIFIED WITH: J.LINDSAY,RN 08/06/14 0831 BY BSLADE     Cliffton Asters, MD [facility] [phone] pager   [phone] cell 08/08/2014, 2:43 PM

## 2014-08-08 NOTE — Progress Notes (Signed)
ANTIBIOTIC CONSULT NOTE - FOLLOW UP  Pharmacy Consult for Vancomycin Indication: MRSA bacteremia and UTI  No Known Allergies  Patient Measurements: Height:  (170.2 cm) Weight: 187 lb 9.8 oz (85.1 kg) IBW/kg (Calculated) : 66.1  Vital Signs: Temp: 98.3 F (36.8 C) (03/24 1054) Temp Source: Oral (03/24 1054) BP: 121/76 mmHg (03/24 1054) Pulse Rate: 106 (03/24 1054) Intake/Output from previous day: 03/23 0701 - 03/24 0700 In: 935 [P.O.:60; I.V.:825; IV Piggyback:50] Out: 2875 [Urine:2875] Intake/Output from this shift: Total I/O In: 900 [I.V.:300; IV Piggyback:600] Out: 1275 [Urine:1275]  Labs:  Recent Labs  08/06/14 0256 08/06/14 0849  08/07/14 0236 08/07/14 1700 08/08/14 0554  WBC 8.6  --   --  6.7  --  4.8  HGB 11.8*  --   < > 10.9* 11.6* 10.9*  PLT 42*  --   --  33*  --  39*  CREATININE 0.92 0.90  --  0.72  --  0.65  < > = values in this interval not displayed. Estimated Creatinine Clearance: 108.8 mL/min (by C-G formula based on Cr of 0.65).  Recent Labs  08/08/14 0703  VANCOTROUGH 13.7     Microbiology: Recent Results (from the past 720 hour(s))  Urine culture     Status: None   Collection Time: 08/05/14 10:11 PM  Result Value Ref Range Status   Specimen Description URINE, CLEAN CATCH  Final   Special Requests ADDED 0004 08/06/14  Final   Colony Count   Final    >=100,000 COLONIES/ML Performed at [facility]    Culture   Final    METHICILLIN RESISTANT STAPHYLOCOCCUS AUREUS Note: RIFAMPIN AND GENTAMICIN SHOULD NOT BE USED AS SINGLE DRUGS FOR TREATMENT OF STAPH INFECTIONS. CRITICAL RESULT CALLED TO, READ BACK BY AND VERIFIED WITH: LEE WORLEY AT 3:04 A.M. ON 08/08/2014 WARRB Performed at [facility]    Report Status 08/08/2014 FINAL  Final   Organism ID, Bacteria METHICILLIN RESISTANT STAPHYLOCOCCUS AUREUS  Final      Susceptibility   Methicillin resistant staphylococcus aureus - MIC*    GENTAMICIN <=0.5 SENSITIVE Sensitive     LEVOFLOXACIN 4 INTERMEDIATE Intermediate     NITROFURANTOIN <=16 SENSITIVE Sensitive     OXACILLIN >=4 RESISTANT Resistant     PENICILLIN >=0.5 RESISTANT Resistant     RIFAMPIN <=0.5 SENSITIVE Sensitive     TRIMETH/SULFA <=10 SENSITIVE Sensitive     VANCOMYCIN 1 SENSITIVE Sensitive     TETRACYCLINE <=1 SENSITIVE Sensitive     * METHICILLIN RESISTANT STAPHYLOCOCCUS AUREUS  Blood Culture (routine x 2)     Status: None (Preliminary result)   Collection Time: 08/05/14 11:52 PM  Result Value Ref Range Status   Specimen Description BLOOD LEFT ANTECUBITAL  Final   Special Requests BOTTLES DRAWN AEROBIC AND ANAEROBIC 5CC EA  Final   Culture   Final    STAPHYLOCOCCUS AUREUS Note: RIFAMPIN AND GENTAMICIN SHOULD NOT BE USED AS SINGLE DRUGS FOR TREATMENT OF STAPH INFECTIONS. SUSCEPTIBILITIES PERFORMED ON PREVIOUS CULTURE WITHIN THE LAST 5 DAYS. Note: Gram Stain Report Called to,Read Back By and Verified With: HEATHER RICHARD 08/06/14 AT 2355 RIDK Performed at [facility]    Report Status PENDING  Incomplete  Blood Culture (routine x 2)     Status: None (Preliminary result)   Collection Time: 08/05/14 11:55 PM  Result Value Ref Range Status   Specimen Description BLOOD LEFT WRIST  Final   Special Requests BOTTLES DRAWN AEROBIC AND ANAEROBIC 5CC EA  Final  Culture   Final    STAPHYLOCOCCUS AUREUS Note: RIFAMPIN AND GENTAMICIN SHOULD NOT BE USED AS SINGLE DRUGS FOR TREATMENT OF STAPH INFECTIONS. Note: Gram Stain Report Called to,Read Back By and Verified With: HEATHER RICHARD 07/26/14 AT 2355 RIDK [facility]    Report Status PENDING  Incomplete  MRSA PCR Screening     Status: Abnormal   Collection Time: 08/06/14  4:00 AM  Result Value Ref Range Status   MRSA by PCR POSITIVE (A) NEGATIVE Final    Comment:        The GeneXpert MRSA Assay (FDA approved for NASAL specimens only), is one component of a comprehensive MRSA colonization surveillance program. It is  not intended to diagnose MRSA infection nor to guide or monitor treatment for MRSA infections. RESULT CALLED TO, READ BACK BY AND VERIFIED WITH: J.LINDSAY,RN 08/06/14 0831 BY BSLADE     Anti-infectives    Start     Dose/Rate Route Frequency Ordered Stop   08/08/14 1600  vancomycin (VANCOCIN) 1,250 mg in sodium chloride 0.9 % 250 mL IVPB     1,250 mg 166.7 mL/hr over 90 Minutes Intravenous 3 times per day 08/08/14 1122     08/07/14 0045  vancomycin (VANCOCIN) IVPB 1000 mg/200 mL premix  Status:  Discontinued     1,000 mg 200 mL/hr over 60 Minutes Intravenous 3 times per day 08/07/14 0013 08/08/14 1122   08/06/14 1200  ceFEPIme (MAXIPIME) 1 g in dextrose 5 % 50 mL IVPB  Status:  Discontinued     1 g 100 mL/hr over 30 Minutes Intravenous Every 8 hours 08/06/14 1105 08/07/14 1939   08/06/14 0200  piperacillin-tazobactam (ZOSYN) IVPB 3.375 g  Status:  Discontinued     3.375 g 12.5 mL/hr over 240 Minutes Intravenous Every 8 hours 08/06/14 0159 08/06/14 1013   08/06/14 0000  cefTRIAXone (ROCEPHIN) 1 g in dextrose 5 % 50 mL IVPB     1 g 100 mL/hr over 30 Minutes Intravenous  Once 08/05/14 2345 08/06/14 0029   08/05/14 2330  cefTRIAXone (ROCEPHIN) 1 g in dextrose 5 % 50 mL IVPB     1 g 100 mL/hr over 30 Minutes Intravenous  Once 08/05/14 2318 08/06/14 0005      Assessment: 54 y.o. Male on Vancomycin (Day #3) for MRSA bacteremia/UTI. ID consulted. TTE negative for vegetation - likely will need TEE. Afeb. WBC WNL. SCr remains stable, UOP good.  Vancomycin trough 13.7 mcg/ml (subtherapeutic) on 1gm IV q8h  Goal of Therapy:  Vancomycin trough level 15-20 mcg/ml  Plan:  Change Vancomycin to 1250mg  IV q8h. F/u renal fxn, cx data, and pt's progress F/u Vanc trough at new Css  Christoper Fabianaron Pang Robers, PharmD, BCPS Clinical pharmacist, pager [phone] 08/08/2014,11:41 AM

## 2014-08-09 DIAGNOSIS — E118 Type 2 diabetes mellitus with unspecified complications: Secondary | ICD-10-CM

## 2014-08-09 DIAGNOSIS — R7989 Other specified abnormal findings of blood chemistry: Secondary | ICD-10-CM | POA: Diagnosis present

## 2014-08-09 LAB — CBC WITH DIFFERENTIAL/PLATELET
Basophils Absolute: 0 10*3/uL (ref 0.0–0.1)
Basophils Relative: 0 % (ref 0–1)
Eosinophils Absolute: 0.1 10*3/uL (ref 0.0–0.7)
Eosinophils Relative: 2 % (ref 0–5)
HCT: 32.4 % — ABNORMAL LOW (ref 39.0–52.0)
Hemoglobin: 10.8 g/dL — ABNORMAL LOW (ref 13.0–17.0)
LYMPHS PCT: 17 % (ref 12–46)
Lymphs Abs: 0.7 10*3/uL (ref 0.7–4.0)
MCH: 30.9 pg (ref 26.0–34.0)
MCHC: 33.3 g/dL (ref 30.0–36.0)
MCV: 92.8 fL (ref 78.0–100.0)
MONO ABS: 0.9 10*3/uL (ref 0.1–1.0)
Monocytes Relative: 22 % — ABNORMAL HIGH (ref 3–12)
Neutro Abs: 2.4 10*3/uL (ref 1.7–7.7)
Neutrophils Relative %: 59 % (ref 43–77)
Platelets: 43 10*3/uL — ABNORMAL LOW (ref 150–400)
RBC: 3.49 MIL/uL — ABNORMAL LOW (ref 4.22–5.81)
RDW: 15.2 % (ref 11.5–15.5)
WBC: 4.1 10*3/uL (ref 4.0–10.5)

## 2014-08-09 LAB — COMPREHENSIVE METABOLIC PANEL
ALK PHOS: 73 U/L (ref 39–117)
ALT: 29 U/L (ref 0–53)
AST: 72 U/L — AB (ref 0–37)
Albumin: 2.4 g/dL — ABNORMAL LOW (ref 3.5–5.2)
Anion gap: 7 (ref 5–15)
BUN: 8 mg/dL (ref 6–23)
CALCIUM: 8 mg/dL — AB (ref 8.4–10.5)
CHLORIDE: 107 mmol/L (ref 96–112)
CO2: 20 mmol/L (ref 19–32)
Creatinine, Ser: 0.61 mg/dL (ref 0.50–1.35)
GFR calc Af Amer: >90 mL/min (ref >90)
GFR calc non Af Amer: >90 mL/min (ref >90)
Glucose, Bld: 118 mg/dL — ABNORMAL HIGH (ref 70–99)
Potassium: 3.6 mmol/L (ref 3.5–5.1)
Sodium: 134 mmol/L — ABNORMAL LOW (ref 135–145)
Total Bilirubin: 2.2 mg/dL — ABNORMAL HIGH (ref 0.3–1.2)
Total Protein: 6.2 g/dL (ref 6.0–8.3)

## 2014-08-09 LAB — CULTURE, BLOOD (ROUTINE X 2)

## 2014-08-09 LAB — GLUCOSE, CAPILLARY
GLUCOSE-CAPILLARY: 238 mg/dL — AB (ref 70–99)
GLUCOSE-CAPILLARY: 223 mg/dL — AB (ref 70–99)
Glucose-Capillary: 115 mg/dL — ABNORMAL HIGH (ref 70–99)
Glucose-Capillary: 122 mg/dL — ABNORMAL HIGH (ref 70–99)

## 2014-08-09 LAB — AMMONIA: Ammonia: 53 umol/L — ABNORMAL HIGH (ref 11–32)

## 2014-08-09 LAB — PHOSPHORUS: PHOSPHORUS: 1.5 mg/dL — AB (ref 2.3–4.6)

## 2014-08-09 MED ORDER — K PHOS MONO-SOD PHOS DI & MONO 155-852-130 MG PO TABS
250.0000 mg | ORAL_TABLET | Freq: Three times a day (TID) | ORAL | Status: DC
  Administered 2014-08-09 – 2014-08-14 (×16): 250 mg via ORAL
  Filled 2014-08-09 (×17): qty 1

## 2014-08-09 NOTE — Progress Notes (Signed)
Patient ID: John Santiago, male   DOB: 03-Dec-1960, 54 y.o.   MRN: [id number]         [facility]    Date of Admission:  08/05/2014           Day 4 vancomycin  Principal Problem:   Bacteremia due to methicillin resistant Staphylococcus aureus Active Problems:   Sepsis   Altered mental status   UTI (lower urinary tract infection)   Hematuria   Thrombocytopenia   Cigarette smoker   Normocytic anemia   Depression   Diabetes mellitus without complication   Hyperlipidemia   Hypertension   Urinary tract infectious disease   Nephrolithiasis   Hypomagnesemia   Hypokalemia   Hyponatremia   . antiseptic oral rinse  7 mL Mouth Rinse q12n4p  . chlorhexidine  15 mL Mouth Rinse BID  . Chlorhexidine Gluconate Cloth  6 each Topical Q0600  . lactulose  20 g Oral BID  . metoprolol tartrate  25 mg Oral BID  . mupirocin ointment  1 application Nasal BID  . potassium chloride  40 mEq Oral BID  . terazosin  4 mg Oral QHS  . vancomycin  1,250 mg Intravenous 3 times per day    Subjective: He is having some left temporal headache which is not unusual for him. Overall he is feeling much better.  Review of Systems: Pertinent items are noted in HPI.  Past Medical History  Diagnosis Date  . Depression   . Diabetes mellitus without complication   . Hyperlipidemia   . Hypertension   . Erectile dysfunction   . Fracture of right lower leg     History  Substance Use Topics  . Smoking status: Current Every Day Smoker  . Smokeless tobacco: Not on file  . Alcohol Use: Not on file    History reviewed. No pertinent family history. No Known Allergies  OBJECTIVE: Blood pressure 115/62, pulse 88, temperature 98.1 F (36.7 C), temperature source Oral, resp. rate 22, height  (1.702 m), weight 187 lb 9.8 oz (85.1 kg), SpO2 96 %. General: he is sleepy but answers questions appropriately. He is in no distress Skin: no splinter or conjunctival hemorrhages Lungs:  clear Cor: regular S1 and S2 with no murmurs Abdomen: soft and nontender. No CVA tenderness  Lab Results Lab Results  Component Value Date   WBC 4.1 08/09/2014   HGB 10.8* 08/09/2014   HCT 32.4* 08/09/2014   MCV 92.8 08/09/2014   PLT 43* 08/09/2014    Lab Results  Component Value Date   CREATININE 0.61 08/09/2014   BUN 8 08/09/2014   NA 134* 08/09/2014   K 3.6 08/09/2014   CL 107 08/09/2014   CO2 20 08/09/2014    Lab Results  Component Value Date   ALT 29 08/09/2014   AST 72* 08/09/2014   ALKPHOS 73 08/09/2014   BILITOT 2.2* 08/09/2014     Microbiology: Recent Results (from the past 240 hour(s))  Urine culture     Status: None   Collection Time: 08/05/14 10:11 PM  Result Value Ref Range Status   Specimen Description URINE, CLEAN CATCH  Final   Special Requests ADDED 0004 08/06/14  Final   Colony Count   Final    >=100,000 COLONIES/ML Performed at [facility]    Culture   Final    METHICILLIN RESISTANT STAPHYLOCOCCUS AUREUS Note: RIFAMPIN AND GENTAMICIN SHOULD NOT BE USED AS SINGLE DRUGS FOR TREATMENT OF STAPH INFECTIONS. CRITICAL RESULT CALLED TO, READ  BACK BY AND VERIFIED WITH: LEE WORLEY AT 3:04 A.M. ON 08/08/2014 WARRB [facility]formed [facility]    Report Status 08/08/2014 FINAL  Final   Organism ID, Bacteria METHICILLIN RESISTANT STAPHYLOCOCCUS AUREUS  Final      Susceptibility   Methicillin resistant staphylococcus aureus - MIC*    GENTAMICIN <=0.5 SENSITIVE Sensitive     LEVOFLOXACIN 4 INTERMEDIATE Intermediate     NITROFURANTOIN <=16 SENSITIVE Sensitive     OXACILLIN >=4 RESISTANT Resistant     PENICILLIN >=0.5 RESISTANT Resistant     RIFAMPIN <=0.5 SENSITIVE Sensitive     TRIMETH/SULFA <=10 SENSITIVE Sensitive     VANCOMYCIN 1 SENSITIVE Sensitive     TETRACYCLINE <=1 SENSITIVE Sensitive     * METHICILLIN RESISTANT STAPHYLOCOCCUS AUREUS  Blood Culture (routine x 2)     Status: None   Collection Time: 08/05/14 11:52 PM  Result Value  Ref Range Status   Specimen Description BLOOD LEFT ANTECUBITAL  Final   Special Requests BOTTLES DRAWN AEROBIC AND ANAEROBIC 5CC EA  Final   Culture   Final    STAPHYLOCOCCUS AUREUS Note: RIFAMPIN AND GENTAMICIN SHOULD NOT BE USED AS SINGLE DRUGS FOR TREATMENT OF STAPH INFECTIONS. SUSCEPTIBILITIES PERFORMED ON PREVIOUS CULTURE WITHIN THE LAST 5 DAYS. Note: Gram Stain Report Called to,Read Back By and Verified With: HEATHER RICHARD 08/06/14 AT 2355 RIDK Performed at [facility]    Report Status 08/09/2014 FINAL  Final  Blood Culture (routine x 2)     Status: None   Collection Time: 08/05/14 11:55 PM  Result Value Ref Range Status   Specimen Description BLOOD LEFT WRIST  Final   Special Requests BOTTLES DRAWN AEROBIC AND ANAEROBIC 5CC EA  Final   Culture   Final    METHICILLIN RESISTANT STAPHYLOCOCCUS AUREUS Note: RIFAMPIN AND GENTAMICIN SHOULD NOT BE USED AS SINGLE DRUGS FOR TREATMENT OF STAPH INFECTIONS. This organism DOES NOT demonstrate inducible Clindamycin resistance in vitro. CRITICAL RESULT CALLED TO, READ BACK BY AND VERIFIED WITH: CAROL SCHILLER @  3:28 PM 08/08/14 BY DWEEKS Note: Gram Stain Report Called to,Read Back By and Verified With: HEATHER RICHARD 07/26/14 AT 2355 RIDK Performed at [facility]    Report Status 08/09/2014 FINAL  Final   Organism ID, Bacteria METHICILLIN RESISTANT STAPHYLOCOCCUS AUREUS  Final      Susceptibility   Methicillin resistant staphylococcus aureus - MIC*    CLINDAMYCIN <=0.25 SENSITIVE Sensitive     ERYTHROMYCIN >=8 RESISTANT Resistant     GENTAMICIN <=0.5 SENSITIVE Sensitive     LEVOFLOXACIN 4 INTERMEDIATE Intermediate     OXACILLIN >=4 RESISTANT Resistant     PENICILLIN >=0.5 RESISTANT Resistant     RIFAMPIN <=0.5 SENSITIVE Sensitive     TRIMETH/SULFA <=10 SENSITIVE Sensitive     VANCOMYCIN 1 SENSITIVE Sensitive     TETRACYCLINE <=1 SENSITIVE Sensitive     * METHICILLIN RESISTANT STAPHYLOCOCCUS AUREUS  MRSA PCR Screening      Status: Abnormal   Collection Time: 08/06/14  4:00 AM  Result Value Ref Range Status   MRSA by PCR POSITIVE (A) NEGATIVE Final    Comment:        The GeneXpert MRSA Assay (FDA approved for NASAL specimens only), is one component of a comprehensive MRSA colonization surveillance program. It is not intended to diagnose MRSA infection nor to guide or monitor treatment for MRSA infections. RESULT CALLED TO, READ BACK BY AND VERIFIED WITH: J.[facility] HospitalINDSAY,RN 08/06/14 0831 BY BSLADE     Assessment: He is  improving on therapy for MRSA bacteremia complicating probable pyelonephritis. Repeat blood cultures are negative so far.  Plan: 1. Continue vancomycin 2. Hold off on PICC placement until blood cultures are negative 3. Recommend TEE 4. Please call my partner, Dr. Merceda Elks [phone]), for any infectious disease questions this weekend  Cliffton Asters, MD [facility] for Infectious Disease [facility] [phone] pager   [phone] cell 08/09/2014, 10:43 AM

## 2014-08-09 NOTE — Progress Notes (Signed)
    [facility] has been requested to perform a transesophageal echocardiogram on 08/09/2014 for MRSA bacteremia.  After careful review of history and examination, the risks and benefits of transesophageal echocardiogram have been explained including risks of esophageal damage, perforation (1:10,000 risk), bleeding, pharyngeal hematoma as well as other potential complications associated with conscious sedation including aspiration, arrhythmia, respiratory failure and death. Alternatives to treatment were discussed, questions were answered. Patient is willing to proceed.   Unfortunately, endoscopy is currently closed. Card master will schedule Monday morning. NPO past midnight on Sunday night. Repeat CBC on Monday morning as patient platelet is 43 this morning. Hopefully platelet will be above 50 to complete TEE. Nurse to call on Monday morning to verify schedule.  Azalee CourseMeng, Ronnett Pullin, [place]PA-C 08/09/2014 4:13 PM

## 2014-08-09 NOTE — Progress Notes (Signed)
John Santiago TEAM 1 - Stepdown/ICU TEAM Progress Note  John Santiago WUJ:[id number] DOB: 1960/11/29 DOA: 08/05/2014 PCP: No primary care provider on file.  Admit HPI / Brief Narrative: John Santiago is a 54 y.o. male, with no significant past medical history presenting with altered mental status. He was driving and stopped and told his wife he cannot drive anymore. The patient was brought to the emergency room and code stroke was called. Patient was evaluated by neurology, had a negative CT and MRI of the head. Patient is still confused and cannot give significant history. Denies any chest pains, shortness of breath abdominal pain but had an episode of vomiting. Patient reports drinking alcohol today but his alcohol level was less than 5. Patient had hematuria while he was in the emergency room and reports being treated for a urinary tract infection around one month ago at the [facility]. No history of trauma. His ammonia level came back elevated although he's not a chronic alcoholic according to the family. His lactic acid level was over 5.   HPI/Subjective: 3/25 A/O 4 states feels good today.    Assessment/Plan: MRSA Urosepsis w/ 2/2 bacteremia  -Patient was treated within the last 30 days for UTI by the [facility] hospital; patient does not recall which to antibiotics he was given.  -Continue normal saline to 1 ml/hr -Continue vancomycin per ID; patient understands well the PICC line when blood cultures return clear -Echocardiogram; normal see results below -Patient will require a TEE to evaluate his valves.  Recurrent urinary tract infection- L non obstructing renal stone  -See urosepsis  Hematuria -Monitor most likely secondary to urosepsis, if does not clear with clearance of infection will require further workup by urology   Increased ammonia level -Normal liver echogenicity on [place] - concerning for possible EtOH abuse  -Prominence in the region of the caudate lobe which may be hypertrophy of  the liver. May require further evaluation as an outpatient after patient has recovered -Ammonia level elevated but is trending down will continue to follow. (Secondary to shock liver from his sepsis?)  -Continue lactulose 20 g BID  Thrombocytopenia  -concerning for possible EtOH abuse - follow -Patient to have TEE on Monday may have to transfuse platelets  Hypomagnesemia -Resolved continue to follow    Hypokalemia  -Normalized continue to monitor    Hyponatremia  -concerning for possible EtOH abuse - follow -See urosepsis -Improving with hydration  Hypophosphatemia -K-Phos 250 mg BID for 2 doses   Code Status: FULL Family Communication: no family present at time of exam Disposition Plan: Resolution sepsis    Consultants: NA  Procedure/Significant Events: 3/22 echocardiogram;- Left ventricle: mild LVH.LVEF= 55% to 60%. 3/22 abdominal ultrasound; Gallbladder: Contracted in nature. Wall thickening is noted  - A minimal amount of pericholecystic fluid is noted. No gallstones are seen.   Culture 3/21 urine positive MRSA 3/21 blood Lt Antecubital/wrist positive MRSA 3/22 MRSA by PCR positive   Antibiotics: Ceftriaxone 3/21 1 dose Zosyn 3/21>> stopped 3/22 Cefepime 3/22 > stopped 3/23 Vanc 3/22 >    DVT prophylaxis: SCD   Devices NA   LINES / TUBES:      Continuous Infusions: . sodium chloride 75 mL/hr at 08/09/14 1507    Objective: VITAL SIGNS: Temp: 98.9 F (37.2 C) (03/25 1500) Temp Source: Oral (03/25 1500) BP: 134/74 mmHg (03/25 1500) Pulse Rate: 89 (03/25 1500) SPO2; FIO2:   Intake/Output Summary (Last 24 hours) at 08/09/14 1615 Last data filed at 08/09/14 1500  Gross per  24 hour  Intake   3570 ml  Output   4700 ml  Net  -1130 ml     Exam: General: A/O 4, NAD, lying in bed comfortably,  No acute respiratory distress Lungs: Clear to auscultation bilaterally without wheezes or crackles Cardiovascular: Regular rate and rhythm  without murmur gallop or rub normal S1 and S2 Abdomen: Nontender, nondistended, soft, bowel sounds positive, no rebound, no ascites, no appreciable mass,Negative CVA tenderness  Extremities: No significant cyanosis, clubbing, or edema bilateral lower extremities  Data Reviewed: Basic Metabolic Panel:  Recent Labs Lab 08/06/14 0256 08/06/14 0849 08/07/14 0236 08/08/14 0554 08/09/14 0402  NA 137 137 134* 132* 134*  K 3.8 3.5 3.2* 3.3* 3.6  CL 104 105 107 105 107  CO2 GLUCOSE 147* 146* 166* 106* 118*  BUN CREATININE 0.92 0.90 0.72 0.65 0.61  CALCIUM 8.5 8.2* 8.2* 7.9* 8.0*  MG  --  1.4*  --  2.0  --   PHOS  --   --   --  1.8* 1.5*   Liver Function Tests:  Recent Labs Lab 08/05/14 2029 08/06/14 0849 08/07/14 0236 08/08/14 0554 08/09/14 0402  AST 69* 40* 31 33 72*  ALT ALKPHOS 95 70 61 59 73  BILITOT 3.1* 2.1* 2.3* 2.2* 2.2*  PROT 7.4 6.1 6.1 6.1 6.2  ALBUMIN 3.4* 2.7* 2.5* 2.4* 2.4*    Recent Labs Lab 08/05/14 2344  LIPASE 26    Recent Labs Lab 08/05/14 2155 08/06/14 0256 08/08/14 0554 08/09/14 0402  AMMONIA 86* 85* 75* 53*   CBC:  Recent Labs Lab 08/05/14 2029 08/05/14 2035 08/06/14 0256 08/06/14 1927 08/07/14 0236 08/07/14 1700 08/08/14 0554 08/09/14 0402  WBC 7.2  --  8.6  --  6.7  --  4.8 4.1  NEUTROABS 6.0  --   --   --  5.1  --  3.1 2.4  HGB 12.8* 14.3 11.8* 11.2* 10.9* 11.6* 10.9* 10.8*  HCT 38.8* 42.0 35.2*  --  33.3*  --  32.7* 32.4*  MCV 95.1  --  94.1  --  94.6  --  93.2 92.8  PLT 45*  --  42*  --  33*  --  39* 43*   Cardiac Enzymes: No results for input(s): CKTOTAL, CKMB, CKMBINDEX, TROPONINI in the last 168 hours. BNP (last 3 results) No results for input(s): BNP in the last 8760 hours.  ProBNP (last 3 results) No results for input(s): PROBNP in the last 8760 hours.  CBG:  Recent Labs Lab 08/08/14 1106 08/08/14 1605 08/08/14 2152 08/09/14 0729 08/09/14 1144  GLUCAP 195*  167* 108* 115* 238*    Recent Results (from the past 240 hour(s))  Urine culture     Status: None   Collection Time: 08/05/14 10:11 PM  Result Value Ref Range Status   Specimen Description URINE, CLEAN CATCH  Final   Special Requests ADDED 0004 08/06/14  Final   Colony Count   Final    >=100,000 COLONIES/ML Performed at [facility]    Culture   Final    METHICILLIN RESISTANT STAPHYLOCOCCUS AUREUS Note: RIFAMPIN AND GENTAMICIN SHOULD NOT BE USED AS SINGLE DRUGS FOR TREATMENT OF STAPH INFECTIONS. CRITICAL RESULT CALLED TO, READ BACK BY AND VERIFIED WITH: LEE WORLEY AT 3:04 A.M. ON 08/08/2014 WARRB Performed at [facility]    Report Status 08/08/2014 FINAL  Final   Organism ID, Bacteria METHICILLIN  RESISTANT STAPHYLOCOCCUS AUREUS  Final      Susceptibility   Methicillin resistant staphylococcus aureus - MIC*    GENTAMICIN <=0.5 SENSITIVE Sensitive     LEVOFLOXACIN 4 INTERMEDIATE Intermediate     NITROFURANTOIN <=16 SENSITIVE Sensitive     OXACILLIN >=4 RESISTANT Resistant     PENICILLIN >=0.5 RESISTANT Resistant     RIFAMPIN <=0.5 SENSITIVE Sensitive     TRIMETH/SULFA <=10 SENSITIVE Sensitive     VANCOMYCIN 1 SENSITIVE Sensitive     TETRACYCLINE <=1 SENSITIVE Sensitive     * METHICILLIN RESISTANT STAPHYLOCOCCUS AUREUS  Blood Culture (routine x 2)     Status: None   Collection Time: 08/05/14 11:52 PM  Result Value Ref Range Status   Specimen Description BLOOD LEFT ANTECUBITAL  Final   Special Requests BOTTLES DRAWN AEROBIC AND ANAEROBIC 5CC EA  Final   Culture   Final    STAPHYLOCOCCUS AUREUS Note: RIFAMPIN AND GENTAMICIN SHOULD NOT BE USED AS SINGLE DRUGS FOR TREATMENT OF STAPH INFECTIONS. SUSCEPTIBILITIES PERFORMED ON PREVIOUS CULTURE WITHIN THE LAST 5 DAYS. Note: Gram Stain Report Called to,Read Back By and Verified With: HEATHER RICHARD 08/06/14 AT 2355 RIDK [facility]formed at [facility]    Report Status 08/09/2014 FINAL  Final  Blood Culture (routine x  2)     Status: None   Collection Time: 08/05/14 11:55 PM  Result Value Ref Range Status   Specimen Description BLOOD LEFT WRIST  Final   Special Requests BOTTLES DRAWN AEROBIC AND ANAEROBIC 5CC EA  Final   Culture   Final    METHICILLIN RESISTANT STAPHYLOCOCCUS AUREUS Note: RIFAMPIN AND GENTAMICIN SHOULD NOT BE USED AS SINGLE DRUGS FOR TREATMENT OF STAPH INFECTIONS. This organism DOES NOT demonstrate inducible Clindamycin resistance in vitro. CRITICAL RESULT CALLED TO, READ BACK BY AND VERIFIED WITH: CAROL SCHILLER @  3:28 PM 08/08/14 BY DWEEKS Note: Gram Stain Report Called to,Read Back By and Verified With: HEATHER RICHARD 07/26/14 AT 2355 RIDK Performed at [facility]    Report Status 08/09/2014 FINAL  Final   Organism ID, Bacteria METHICILLIN RESISTANT STAPHYLOCOCCUS AUREUS  Final      Susceptibility   Methicillin resistant staphylococcus aureus - MIC*    CLINDAMYCIN <=0.25 SENSITIVE Sensitive     ERYTHROMYCIN >=8 RESISTANT Resistant     GENTAMICIN <=0.5 SENSITIVE Sensitive     LEVOFLOXACIN 4 INTERMEDIATE Intermediate     OXACILLIN >=4 RESISTANT Resistant     PENICILLIN >=0.5 RESISTANT Resistant     RIFAMPIN <=0.5 SENSITIVE Sensitive     TRIMETH/SULFA <=10 SENSITIVE Sensitive     VANCOMYCIN 1 SENSITIVE Sensitive     TETRACYCLINE <=1 SENSITIVE Sensitive     * METHICILLIN RESISTANT STAPHYLOCOCCUS AUREUS  MRSA PCR Screening     Status: Abnormal   Collection Time: 08/06/14  4:00 AM  Result Value Ref Range Status   MRSA by PCR POSITIVE (A) NEGATIVE Final    Comment:        The GeneXpert MRSA Assay (FDA approved for NASAL specimens only), is one component of a comprehensive MRSA colonization surveillance program. It is not intended to diagnose MRSA infection nor to guide or monitor treatment for MRSA infections. RESULT CALLED TO, READ BACK BY AND VERIFIED WITH: J.LINDSAY,RN 08/06/14 0831 BY BSLADE      Studies:  Recent x-ray studies have been reviewed in detail by  the Attending Physician  Scheduled Meds:  Scheduled Meds: . antiseptic oral rinse  7 mL Mouth Rinse q12n4p  . chlorhexidine  15  mL Mouth Rinse BID  . Chlorhexidine Gluconate Cloth  6 each Topical Q0600  . lactulose  20 g Oral BID  . metoprolol tartrate  25 mg Oral BID  . mupirocin ointment  1 application Nasal BID  . phosphorus  250 mg Oral TID  . terazosin  4 mg Oral QHS  . vancomycin  1,250 mg Intravenous 3 times per day    Time spent on care of this patient: 40 mins   Vickie Melnik, Roselind Messier , MD  [facility] Office  [phone] Pager - [phone]  On-Call/Text Page:      [email or web address]      password [email or web address]  If 7PM-7AM, please contact night-coverage [email or web address] Password [email or web address] 08/09/2014, 4:15 PM   LOS: 3 days   Care during the described time interval was provided by me .  I have reviewed this patient's available data, including medical history, events of note, physical examination, radiology studies and test results as part of my evaluation  Carolyne Littles, MD [phone] Pager

## 2014-08-10 DIAGNOSIS — R7881 Bacteremia: Secondary | ICD-10-CM | POA: Diagnosis present

## 2014-08-10 LAB — COMPREHENSIVE METABOLIC PANEL
ALT: 29 U/L (ref 0–53)
ANION GAP: 4 — AB (ref 5–15)
AST: 59 U/L — AB (ref 0–37)
Albumin: 2.3 g/dL — ABNORMAL LOW (ref 3.5–5.2)
Alkaline Phosphatase: 76 U/L (ref 39–117)
BILIRUBIN TOTAL: 1.4 mg/dL — AB (ref 0.3–1.2)
BUN: 7 mg/dL (ref 6–23)
CHLORIDE: 110 mmol/L (ref 96–112)
CO2: 23 mmol/L (ref 19–32)
Calcium: 8 mg/dL — ABNORMAL LOW (ref 8.4–10.5)
Creatinine, Ser: 0.64 mg/dL (ref 0.50–1.35)
GFR calc non Af Amer: >90 mL/min (ref >90)
GLUCOSE: 94 mg/dL (ref 70–99)
Potassium: 3.5 mmol/L (ref 3.5–5.1)
SODIUM: 137 mmol/L (ref 135–145)
TOTAL PROTEIN: 5.9 g/dL — AB (ref 6.0–8.3)

## 2014-08-10 LAB — GLUCOSE, CAPILLARY
GLUCOSE-CAPILLARY: 104 mg/dL — AB (ref 70–99)
GLUCOSE-CAPILLARY: 154 mg/dL — AB (ref 70–99)
GLUCOSE-CAPILLARY: 195 mg/dL — AB (ref 70–99)
Glucose-Capillary: 175 mg/dL — ABNORMAL HIGH (ref 70–99)

## 2014-08-10 LAB — CBC WITH DIFFERENTIAL/PLATELET
Basophils Absolute: 0 10*3/uL (ref 0.0–0.1)
Basophils Relative: 0 % (ref 0–1)
EOS ABS: 0.1 10*3/uL (ref 0.0–0.7)
EOS PCT: 3 % (ref 0–5)
HCT: 31.2 % — ABNORMAL LOW (ref 39.0–52.0)
Hemoglobin: 10.3 g/dL — ABNORMAL LOW (ref 13.0–17.0)
Lymphocytes Relative: 26 % (ref 12–46)
Lymphs Abs: 1 10*3/uL (ref 0.7–4.0)
MCH: 30.9 pg (ref 26.0–34.0)
MCHC: 33 g/dL (ref 30.0–36.0)
MCV: 93.7 fL (ref 78.0–100.0)
Monocytes Absolute: 0.7 10*3/uL (ref 0.1–1.0)
Monocytes Relative: 19 % — ABNORMAL HIGH (ref 3–12)
Neutro Abs: 1.9 10*3/uL (ref 1.7–7.7)
Neutrophils Relative %: 52 % (ref 43–77)
PLATELETS: 45 10*3/uL — AB (ref 150–400)
RBC: 3.33 MIL/uL — AB (ref 4.22–5.81)
RDW: 15.4 % (ref 11.5–15.5)
WBC: 3.7 10*3/uL — AB (ref 4.0–10.5)

## 2014-08-10 LAB — AMMONIA: Ammonia: 62 umol/L — ABNORMAL HIGH (ref 11–32)

## 2014-08-10 LAB — PHOSPHORUS: Phosphorus: 2.7 mg/dL (ref 2.3–4.6)

## 2014-08-10 LAB — VANCOMYCIN, TROUGH: Vancomycin Tr: 19.9 ug/mL (ref 10.0–20.0)

## 2014-08-10 MED ORDER — SODIUM CHLORIDE 0.9 % IV SOLN
INTRAVENOUS | Status: DC
  Administered 2014-08-13: 500 mL via INTRAVENOUS

## 2014-08-10 MED ORDER — LACTULOSE 10 GM/15ML PO SOLN
20.0000 g | Freq: Three times a day (TID) | ORAL | Status: DC
  Administered 2014-08-10 – 2014-08-14 (×13): 20 g via ORAL
  Filled 2014-08-10 (×14): qty 30

## 2014-08-10 NOTE — Progress Notes (Signed)
ANTIBIOTIC CONSULT NOTE - FOLLOW UP  Pharmacy Consult for vancomycin Indication: MRSA bacteremia  No Known Allergies  Patient Measurements: Height: 5\' 7"  (170.2 cm) Weight: 187 lb 9.8 oz (85.1 kg) IBW/kg (Calculated) : 66.1 Adjusted Body Weight:   Vital Signs: Temp: 98.3 F (36.8 C) (03/26 1550) Temp Source: Oral (03/26 1550) BP: 124/71 mmHg (03/26 1550) Pulse Rate: 81 (03/26 1550) Intake/Output from previous day: 03/25 0701 - 03/26 0700 In: 3815 [P.O.:1440; I.V.:1875; IV Piggyback:500] Out: 4675 [Urine:4675] Intake/Output from this shift: Total I/O In: 1245 [P.O.:720; I.V.:525] Out: 1875 [Urine:1875]  Labs:  Recent Labs  08/08/14 0554 08/09/14 0402 08/10/14 0214  WBC 4.8 4.1 3.7*  HGB 10.9* 10.8* 10.3*  PLT 39* 43* 45*  CREATININE 0.65 0.61 0.64   Estimated Creatinine Clearance: 108.8 mL/min (by C-G formula based on Cr of 0.64).  Recent Labs  08/08/14 0703 08/10/14 1600  VANCOTROUGH 13.7 19.9     Microbiology: Recent Results (from the past 720 hour(s))  Urine culture     Status: None   Collection Time: 08/05/14 10:11 PM  Result Value Ref Range Status   Specimen Description URINE, CLEAN CATCH  Final   Special Requests ADDED 0004 08/06/14  Final   Colony Count   Final    >=100,000 COLONIES/ML Performed at [facility]    Culture   Final    METHICILLIN RESISTANT STAPHYLOCOCCUS AUREUS Note: RIFAMPIN AND GENTAMICIN SHOULD NOT BE USED AS SINGLE DRUGS FOR TREATMENT OF STAPH INFECTIONS. CRITICAL RESULT CALLED TO, READ BACK BY AND VERIFIED WITH: LEE WORLEY AT 3:04 A.M. ON 08/08/2014 WARRB Performed at [facility]    Report Status 08/08/2014 FINAL  Final   Organism ID, Bacteria METHICILLIN RESISTANT STAPHYLOCOCCUS AUREUS  Final      Susceptibility   Methicillin resistant staphylococcus aureus - MIC*    GENTAMICIN <=0.5 SENSITIVE Sensitive     LEVOFLOXACIN 4 INTERMEDIATE Intermediate     NITROFURANTOIN <=16 SENSITIVE Sensitive     OXACILLIN  >=4 RESISTANT Resistant     PENICILLIN >=0.5 RESISTANT Resistant     RIFAMPIN <=0.5 SENSITIVE Sensitive     TRIMETH/SULFA <=10 SENSITIVE Sensitive     VANCOMYCIN 1 SENSITIVE Sensitive     TETRACYCLINE <=1 SENSITIVE Sensitive     * METHICILLIN RESISTANT STAPHYLOCOCCUS AUREUS  Blood Culture (routine x 2)     Status: None   Collection Time: 08/05/14 11:52 PM  Result Value Ref Range Status   Specimen Description BLOOD LEFT ANTECUBITAL  Final   Special Requests BOTTLES DRAWN AEROBIC AND ANAEROBIC 5CC EA  Final   Culture   Final    STAPHYLOCOCCUS AUREUS Note: RIFAMPIN AND GENTAMICIN SHOULD NOT BE USED AS SINGLE DRUGS FOR TREATMENT OF STAPH INFECTIONS. SUSCEPTIBILITIES PERFORMED ON PREVIOUS CULTURE WITHIN THE LAST 5 DAYS. Note: Gram Stain Report Called to,Read Back By and Verified With: HEATHER RICHARD 08/06/14 AT 2355 RIDK Performed at [facility]    Report Status 08/09/2014 FINAL  Final  Blood Culture (routine x 2)     Status: None   Collection Time: 08/05/14 11:55 PM  Result Value Ref Range Status   Specimen Description BLOOD LEFT WRIST  Final   Special Requests BOTTLES DRAWN AEROBIC AND ANAEROBIC 5CC EA  Final   Culture   Final    METHICILLIN RESISTANT STAPHYLOCOCCUS AUREUS Note: RIFAMPIN AND GENTAMICIN SHOULD NOT BE USED AS SINGLE DRUGS FOR TREATMENT OF STAPH INFECTIONS. This organism DOES NOT demonstrate inducible Clindamycin resistance in vitro. CRITICAL RESULT CALLED TO, READ BACK  BY AND VERIFIED WITH: CAROL SCHILLER @  3:28 PM 08/08/14 BY DWEEKS Note: Gram Stain Report Called to,Read Back By and Verified With: HEATHER RICHARD 07/26/14 AT 2355 RIDK Performed at [facility]    Report Status 08/09/2014 FINAL  Final   Organism ID, Bacteria METHICILLIN RESISTANT STAPHYLOCOCCUS AUREUS  Final      Susceptibility   Methicillin resistant staphylococcus aureus - MIC*    CLINDAMYCIN <=0.25 SENSITIVE Sensitive     ERYTHROMYCIN >=8 RESISTANT Resistant     GENTAMICIN <=0.5  SENSITIVE Sensitive     LEVOFLOXACIN 4 INTERMEDIATE Intermediate     OXACILLIN >=4 RESISTANT Resistant     PENICILLIN >=0.5 RESISTANT Resistant     RIFAMPIN <=0.5 SENSITIVE Sensitive     TRIMETH/SULFA <=10 SENSITIVE Sensitive     VANCOMYCIN 1 SENSITIVE Sensitive     TETRACYCLINE <=1 SENSITIVE Sensitive     * METHICILLIN RESISTANT STAPHYLOCOCCUS AUREUS  MRSA PCR Screening     Status: Abnormal   Collection Time: 08/06/14  4:00 AM  Result Value Ref Range Status   MRSA by PCR POSITIVE (A) NEGATIVE Final    Comment:        The GeneXpert MRSA Assay (FDA approved for NASAL specimens only), is one component of a comprehensive MRSA colonization surveillance program. It is not intended to diagnose MRSA infection nor to guide or monitor treatment for MRSA infections. RESULT CALLED TO, READ BACK BY AND VERIFIED WITH: J.[facility] 08/06/14 0831 BY BSLADE   Culture, blood (routine x 2)     Status: None (Preliminary result)   Collection Time: 08/08/14  3:00 PM  Result Value Ref Range Status   Specimen Description BLOOD LEFT HAND  Final   Special Requests BOTTLES DRAWN AEROBIC ONLY 3CC  Final   Culture   Final           BLOOD CULTURE RECEIVED NO GROWTH TO DATE CULTURE WILL BE HELD FOR 5 DAYS BEFORE ISSUING A FINAL NEGATIVE REPORT Performed at [facility]    Report Status PENDING  Incomplete  Culture, blood (routine x 2)     Status: None (Preliminary result)   Collection Time: 08/08/14  3:15 PM  Result Value Ref Range Status   Specimen Description BLOOD LEFT HAND  Final   Special Requests BOTTLES DRAWN AEROBIC ONLY 3CC  Final   Culture   Final           BLOOD CULTURE RECEIVED NO GROWTH TO DATE CULTURE WILL BE HELD FOR 5 DAYS BEFORE ISSUING A FINAL NEGATIVE REPORT Performed at [facility]    Report Status PENDING  Incomplete    Anti-infectives    Start     Dose/Rate Route Frequency Ordered Stop   08/08/14 1600  vancomycin (VANCOCIN) 1,250 mg in sodium chloride 0.9 %  250 mL IVPB     1,250 mg 166.7 mL/hr over 90 Minutes Intravenous 3 times per day 08/08/14 1122     08/07/14 0045  vancomycin (VANCOCIN) IVPB 1000 mg/200 mL premix  Status:  Discontinued     1,000 mg 200 mL/hr over 60 Minutes Intravenous 3 times per day 08/07/14 0013 08/08/14 1122   08/06/14 1200  ceFEPIme (MAXIPIME) 1 g in dextrose 5 % 50 mL IVPB  Status:  Discontinued     1 g 100 mL/hr over 30 Minutes Intravenous Every 8 hours 08/06/14 1105 08/07/14 1939   08/06/14 0200  piperacillin-tazobactam (ZOSYN) IVPB 3.375 g  Status:  Discontinued     3.375 g 12.5 mL/hr  over 240 Minutes Intravenous Every 8 hours 08/06/14 0159 08/06/14 1013   08/06/14 0000  cefTRIAXone (ROCEPHIN) 1 g in dextrose 5 % 50 mL IVPB     1 g 100 mL/hr over 30 Minutes Intravenous  Once 08/05/14 2345 08/06/14 0029   08/05/14 2330  cefTRIAXone (ROCEPHIN) 1 g in dextrose 5 % 50 mL IVPB     1 g 100 mL/hr over 30 Minutes Intravenous  Once 08/05/14 2318 08/06/14 0005      Assessment: 54 yo male with MRSA bacteremia is currently on therapeutic vancomycin.  Vancomycin trough was 19.9.   Goal of Therapy:  Vancomycin trough level 15-20 mcg/ml  Plan:  - continue vancomycin 1250 mg iv q8h - recheck vancomycin trough on Monday to make sure no accumulation - monitor renal function  Urania Pearlman, Tsz-Yin 08/10/2014,5:01 PM
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
## 2014-08-10 NOTE — Progress Notes (Signed)
ANTIBIOTIC CONSULT NOTE - FOLLOW UP  Pharmacy Consult for Vancomycin Indication: MRSA bacteremia and UTI  No Known Allergies  Patient Measurements: Height: 5\' 7"  (170.2 cm) Weight: 187 lb 9.8 oz (85.1 kg) IBW/kg (Calculated) : 66.1  Vital Signs: Temp: 99.3 F (37.4 C) (03/26 0353) Temp Source: Oral (03/26 0353) BP: 118/80 mmHg (03/26 0353) Pulse Rate: 87 (03/26 0353) Intake/Output from previous day: 03/25 0701 - 03/26 0700 In: 3740 [P.O.:1440; I.V.:1800; IV Piggyback:500] Out: 4675 [Urine:4675] Intake/Output from this shift:    Labs:  Recent Labs  08/08/14 0554 08/09/14 0402 08/10/14 0214  WBC 4.8 4.1 3.7*  HGB 10.9* 10.8* 10.3*  PLT 39* 43* 45*  CREATININE 0.65 0.61 0.64   Estimated Creatinine Clearance: 108.8 mL/min (by C-G formula based on Cr of 0.64).  Recent Labs  08/08/14 0703  VANCOTROUGH 13.7     Microbiology: Recent Results (from the past 720 hour(s))  Urine culture     Status: None   Collection Time: 08/05/14 10:11 PM  Result Value Ref Range Status   Specimen Description URINE, CLEAN CATCH  Final   Special Requests ADDED 0004 08/06/14  Final   Colony Count   Final    >=100,000 COLONIES/ML Performed at [facility]    Culture   Final    METHICILLIN RESISTANT STAPHYLOCOCCUS AUREUS Note: RIFAMPIN AND GENTAMICIN SHOULD NOT BE USED AS SINGLE DRUGS FOR TREATMENT OF STAPH INFECTIONS. CRITICAL RESULT CALLED TO, READ BACK BY AND VERIFIED WITH: LEE WORLEY AT 3:04 A.M. ON 08/08/2014 WARRB Performed at [facility]    Report Status 08/08/2014 FINAL  Final   Organism ID, Bacteria METHICILLIN RESISTANT STAPHYLOCOCCUS AUREUS  Final      Susceptibility   Methicillin resistant staphylococcus aureus - MIC*    GENTAMICIN <=0.5 SENSITIVE Sensitive     LEVOFLOXACIN 4 INTERMEDIATE Intermediate     NITROFURANTOIN <=16 SENSITIVE Sensitive     OXACILLIN >=4 RESISTANT Resistant     PENICILLIN >=0.5 RESISTANT Resistant     RIFAMPIN <=0.5 SENSITIVE  Sensitive     TRIMETH/SULFA <=10 SENSITIVE Sensitive     VANCOMYCIN 1 SENSITIVE Sensitive     TETRACYCLINE <=1 SENSITIVE Sensitive     * METHICILLIN RESISTANT STAPHYLOCOCCUS AUREUS  Blood Culture (routine x 2)     Status: None   Collection Time: 08/05/14 11:52 PM  Result Value Ref Range Status   Specimen Description BLOOD LEFT ANTECUBITAL  Final   Special Requests BOTTLES DRAWN AEROBIC AND ANAEROBIC 5CC EA  Final   Culture   Final    STAPHYLOCOCCUS AUREUS Note: RIFAMPIN AND GENTAMICIN SHOULD NOT BE USED AS SINGLE DRUGS FOR TREATMENT OF STAPH INFECTIONS. SUSCEPTIBILITIES PERFORMED ON PREVIOUS CULTURE WITHIN THE LAST 5 DAYS. Note: Gram Stain Report Called to,Read Back By and Verified With: HEATHER RICHARD 08/06/14 AT 2355 RIDK Performed at [facility]    Report Status 08/09/2014 FINAL  Final  Blood Culture (routine x 2)     Status: None   Collection Time: 08/05/14 11:55 PM  Result Value Ref Range Status   Specimen Description BLOOD LEFT WRIST  Final   Special Requests BOTTLES DRAWN AEROBIC AND ANAEROBIC 5CC EA  Final   Culture   Final    METHICILLIN RESISTANT STAPHYLOCOCCUS AUREUS Note: RIFAMPIN AND GENTAMICIN SHOULD NOT BE USED AS SINGLE DRUGS FOR TREATMENT OF STAPH INFECTIONS. This organism DOES NOT demonstrate inducible Clindamycin resistance in vitro. CRITICAL RESULT CALLED TO, READ BACK BY AND VERIFIED WITH: CAROL SCHILLER @  3:28 PM 08/08/14 BY  DWEEKS Note: Gram Stain Report Called to,Read Back By and Verified With: HEATHER RICHARD 07/26/14 AT 2355 RIDK Performed at [facility]    Report Status 08/09/2014 FINAL  Final   Organism ID, Bacteria METHICILLIN RESISTANT STAPHYLOCOCCUS AUREUS  Final      Susceptibility   Methicillin resistant staphylococcus aureus - MIC*    CLINDAMYCIN <=0.25 SENSITIVE Sensitive     ERYTHROMYCIN >=8 RESISTANT Resistant     GENTAMICIN <=0.5 SENSITIVE Sensitive     LEVOFLOXACIN 4 INTERMEDIATE Intermediate     OXACILLIN >=4 RESISTANT  Resistant     PENICILLIN >=0.5 RESISTANT Resistant     RIFAMPIN <=0.5 SENSITIVE Sensitive     TRIMETH/SULFA <=10 SENSITIVE Sensitive     VANCOMYCIN 1 SENSITIVE Sensitive     TETRACYCLINE <=1 SENSITIVE Sensitive     * METHICILLIN RESISTANT STAPHYLOCOCCUS AUREUS  MRSA PCR Screening     Status: Abnormal   Collection Time: 08/06/14  4:00 AM  Result Value Ref Range Status   MRSA by PCR POSITIVE (A) NEGATIVE Final    Comment:        The GeneXpert MRSA Assay (FDA approved for NASAL specimens only), is one component of a comprehensive MRSA colonization surveillance program. It is not intended to diagnose MRSA infection nor to guide or monitor treatment for MRSA infections. RESULT CALLED TO, READ BACK BY AND VERIFIED WITH: J.[facility] 08/06/14 0831 BY BSLADE   Culture, blood (routine x 2)     Status: None (Preliminary result)   Collection Time: 08/08/14  3:00 PM  Result Value Ref Range Status   Specimen Description BLOOD LEFT HAND  Final   Special Requests BOTTLES DRAWN AEROBIC ONLY 3CC  Final   Culture   Final           BLOOD CULTURE RECEIVED NO GROWTH TO DATE CULTURE WILL BE HELD FOR 5 DAYS BEFORE ISSUING A FINAL NEGATIVE REPORT Performed at [facility]    Report Status PENDING  Incomplete  Culture, blood (routine x 2)     Status: None (Preliminary result)   Collection Time: 08/08/14  3:15 PM  Result Value Ref Range Status   Specimen Description BLOOD LEFT HAND  Final   Special Requests BOTTLES DRAWN AEROBIC ONLY 3CC  Final   Culture   Final           BLOOD CULTURE RECEIVED NO GROWTH TO DATE CULTURE WILL BE HELD FOR 5 DAYS BEFORE ISSUING A FINAL NEGATIVE REPORT Performed at [facility]    Report Status PENDING  Incomplete    Anti-infectives    Start     Dose/Rate Route Frequency Ordered Stop   08/08/14 1600  vancomycin (VANCOCIN) 1,250 mg in sodium chloride 0.9 % 250 mL IVPB     1,250 mg 166.7 mL/hr over 90 Minutes Intravenous 3 times per day 08/08/14 1122      08/07/14 0045  vancomycin (VANCOCIN) IVPB 1000 mg/200 mL premix  Status:  Discontinued     1,000 mg 200 mL/hr over 60 Minutes Intravenous 3 times per day 08/07/14 0013 08/08/14 1122   08/06/14 1200  ceFEPIme (MAXIPIME) 1 g in dextrose 5 % 50 mL IVPB  Status:  Discontinued     1 g 100 mL/hr over 30 Minutes Intravenous Every 8 hours 08/06/14 1105 08/07/14 1939   08/06/14 0200  piperacillin-tazobactam (ZOSYN) IVPB 3.375 g  Status:  Discontinued     3.375 g 12.5 mL/hr over 240 Minutes Intravenous Every 8 hours 08/06/14 0159 08/06/14 1013  08/06/14 0000  cefTRIAXone (ROCEPHIN) 1 g in dextrose 5 % 50 mL IVPB     1 g 100 mL/hr over 30 Minutes Intravenous  Once 08/05/14 2345 08/06/14 0029   08/05/14 2330  cefTRIAXone (ROCEPHIN) 1 g in dextrose 5 % 50 mL IVPB     1 g 100 mL/hr over 30 Minutes Intravenous  Once 08/05/14 2318 08/06/14 0005      Assessment: 54 y.o. M admitted 08/05/2014 with AMS, found to have MRSA bacteremia/UTI. Pharmacy consulted to dose vancomycin.     Coag/Heme:  INR 1.66 on admission, thrombocytopenia, hepatic workup in progress.   SCDs for VTE Px  Infectious Disease - MRSA bacteremia/UTI. ID following. TTE neg for vegetation - needs TEE (rescheduled for Monday). Afeb. WBC wnl.   CTX 2gm (1gm x 2) on 3/21 Zosyn x 1 3/22 Maxipime 3/21>> 3/23  Vanc 3/22>> VT 13.7 on 1gm IV q8h - inc to  IV q8 (GOAL 15-20)  3/22 bld x 2 - MRSA 3/22 urine - > 100K MRSA (Vanc MIC 1) 3/22 MRSA PCR + 3/24 Bld x2 -   Nephrology- Scr stable.  CrCl > 100 ml/min Phos repleated,  Excellent UOP  Goal of Therapy:  Vancomycin trough level 15-20 mcg/ml  Plan:  Follow up trough prior to 4p dose on Vancomycin to  IV q8h. Follow up SCr, UOP, cultures, clinical course and adjust as clinically indicated.   Thank you for allowing pharmacy to be a part of this patients care team.  Lovenia Kim Pharm.D., BCPS, AQ-Cardiology Clinical Pharmacist 08/10/2014 8:02 AM Pager: [phone] Phone: [phone]

## 2014-08-11 LAB — COMPREHENSIVE METABOLIC PANEL
ALBUMIN: 2.2 g/dL — AB (ref 3.5–5.2)
ALK PHOS: 94 U/L (ref 39–117)
ALT: 31 U/L (ref 0–53)
AST: 59 U/L — AB (ref 0–37)
Anion gap: 8 (ref 5–15)
BUN: 6 mg/dL (ref 6–23)
CHLORIDE: 108 mmol/L (ref 96–112)
CO2: 21 mmol/L (ref 19–32)
CREATININE: 0.61 mg/dL (ref 0.50–1.35)
Calcium: 8.4 mg/dL (ref 8.4–10.5)
GFR calc non Af Amer: >90 mL/min (ref >90)
GLUCOSE: 126 mg/dL — AB (ref 70–99)
Potassium: 3.7 mmol/L (ref 3.5–5.1)
Sodium: 137 mmol/L (ref 135–145)
TOTAL PROTEIN: 5.4 g/dL — AB (ref 6.0–8.3)
Total Bilirubin: 0.9 mg/dL (ref 0.3–1.2)

## 2014-08-11 LAB — CBC WITH DIFFERENTIAL/PLATELET
Basophils Absolute: 0 10*3/uL (ref 0.0–0.1)
Basophils Relative: 1 % (ref 0–1)
EOS PCT: 3 % (ref 0–5)
Eosinophils Absolute: 0.1 10*3/uL (ref 0.0–0.7)
HEMATOCRIT: 30.9 % — AB (ref 39.0–52.0)
Hemoglobin: 10.2 g/dL — ABNORMAL LOW (ref 13.0–17.0)
Lymphocytes Relative: 22 % (ref 12–46)
Lymphs Abs: 1 10*3/uL (ref 0.7–4.0)
MCH: 30.8 pg (ref 26.0–34.0)
MCHC: 33 g/dL (ref 30.0–36.0)
MCV: 93.4 fL (ref 78.0–100.0)
Monocytes Absolute: 0.6 10*3/uL (ref 0.1–1.0)
Monocytes Relative: 13 % — ABNORMAL HIGH (ref 3–12)
NEUTROS ABS: 2.7 10*3/uL (ref 1.7–7.7)
Neutrophils Relative %: 62 % (ref 43–77)
Platelets: 50 10*3/uL — ABNORMAL LOW (ref 150–400)
RBC: 3.31 MIL/uL — ABNORMAL LOW (ref 4.22–5.81)
RDW: 15.3 % (ref 11.5–15.5)
WBC: 4.3 10*3/uL (ref 4.0–10.5)

## 2014-08-11 LAB — GLUCOSE, CAPILLARY
GLUCOSE-CAPILLARY: 142 mg/dL — AB (ref 70–99)
Glucose-Capillary: 111 mg/dL — ABNORMAL HIGH (ref 70–99)
Glucose-Capillary: 155 mg/dL — ABNORMAL HIGH (ref 70–99)
Glucose-Capillary: 113 mg/dL — ABNORMAL HIGH (ref 70–99)

## 2014-08-11 LAB — MAGNESIUM: Magnesium: 1.7 mg/dL (ref 1.5–2.5)

## 2014-08-11 NOTE — Progress Notes (Addendum)
Patient ID: John Santiago, male   DOB: 02/01/1961, 54 y.o.   MRN: [id number]  [facility] PROGRESS NOTE  John Santiago VQQ:[id number] DOB: 09/07/1960 DOA: 08/09/2014 PCP: No primary care provider on file.   Brief narrative:    54 y.o. male, with no significant past medical history presented with altered mental status. Upon arrival to the ED, initial concern for stroke but CT and MRI brain with no evidence of ischemia. In addition, pt has had some hematuria and was apparently treated for UTI about one month prior to this admission at the [facility] hospital. Pt also reported drinking alcohol and ammonia level was noted to be elevated, lactic acid > 5.   Assessment/Plan:   Sepsis secondary to MRSA bacteremia with pyelonephritis  - Patient was treated within the last 30 days for UTI by the [facility] hospital; does not recall which ABX given  - Continue normal saline to 75 ml/hr - Continue vancomycin per ID; patient understands will require PICC line when blood cultures return clear - Echocardiogram; no acute findings  - cardiology consulted for TEE   Recurrent UTI - L non obstructing renal stone  - Continue vancomycin as noted above   Hematuria - most likely secondary to urosepsis - if does not clear with clearance of infection will require further workup by urology   Increased ammonia level - Normal liver echogenicity on [place] - concerning for possible EtOH abuse  - Ammonia level slowly trending down but continues to stay elevated, ? Shock liver from sepsis  - Increased lactulose 20 g TID  Thrombocytopenia  - concerning for possible EtOH induced bone marrow damage  - Patient to have TEE on Monday may have to transfuse platelets  Anemia of chronic disease, alcohol induced bone marrow damage - no signs of active bleeding - repeat CBC in AM  Hypomagnesemia - WNL  Hypokalemia  - supplemented and WNL this AM  Hyponatremia  - IVF provided and Na is now WNL   Hypophosphatemia - K-Phos 250 mg  BID for 2 doses - Phosphorus WNL  DVT prophylaxis - SCD's  Code Status: Full.  Family Communication:  plan of care discussed with the patient Disposition Plan: Home when stable.   IV access:  Peripheral IV  Procedures and diagnostic studies:    Ct Head Wo Contrast  08/09/14  No CT findings for an acute intracranial process.     Mr Brain Wo Contrast  August 09, 2014  No evidence of acute ischemia.   US Abdomen Complete  08/06/2014   Left renal stone, no obstruction.  Splenomegaly. ? caudate lobe hypertrophy.   CXR 08/06/2014   Minimal bibasilar atelectasis noted.     Medical Consultants:  Cardiology ID  Other Consultants:  None  IAnti-Infectives:   Ceftriaxone 09-Aug-2022 1 dose Zosyn Aug 09, 2022 --> stopped 3/22 Cefepime 3/22 --> stopped 3/23 Vanc 3/22 -->  Culture 2022/08/09 urine positive MRSA August 09, 2022 blood Lt Antecubital/wrist positive MRSA 3/22 MRSA by PCR positive  Debbora Presto, MD  [facility] Pager [phone]  If 7PM-7AM, please contact night-coverage [email or web address] Password [facility] 08/11/2014, 4:45 PM   LOS: 5 days   HPI/Subjective: No events overnight.   Objective: Filed Vitals:   08/10/14 1550 08/10/14 2218 08/11/14 0450 08/11/14 0652  BP: 124/71 122/66 125/69 119/65  Pulse: 81  80   Temp: 98.3 F (36.8 C) 99.2 F (37.3 C) 99.9 F (37.7 C) 98.7 F (37.1 C)  TempSrc: Oral Oral Oral Oral  Resp: 14 20 18 20   Height:  Weight:      SpO2: 99% 97% 97% 96%    Intake/Output Summary (Last 24 hours) at 08/11/14 1645 Last data filed at 08/11/14 1438  Gross per 24 hour  Intake 3577.5 ml  Output   3625 ml  Net  -47.5 ml    Exam:   General:  Pt is alert, follows commands appropriately, not in acute distress  Cardiovascular: Regular rate and rhythm,  no rubs, no gallops  Respiratory: Clear to auscultation bilaterally, no wheezing, no crackles, no rhonchi  Abdomen: Soft, non tender, non distended, bowel sounds present, no guarding  Extremities: pulses DP and PT  palpable bilaterally  Data Reviewed: Basic Metabolic Panel:  Recent Labs Lab 08/06/14 0849 08/07/14 0236 08/08/14 0554 08/09/14 0402 08/10/14 0214 08/11/14 0640  NA 137 134* 132* 134* 137 137  K 3.5 3.2* 3.3* 3.6 3.5 3.7  CL 105 107 105 107 110 108  CO2 GLUCOSE 146* 166* 106* 118* 94 126*  BUN CREATININE 0.90 0.72 0.65 0.61 0.64 0.61  CALCIUM 8.2* 8.2* 7.9* 8.0* 8.0* 8.4  MG 1.4*  --  2.0  --   --  1.7  PHOS  --   --  1.8* 1.5* 2.7  --    Liver Function Tests:  Recent Labs Lab 08/07/14 0236 08/08/14 0554 08/09/14 0402 08/10/14 0214 08/11/14 0640  AST 31 33 72* 59* 59*  ALT ALKPHOS 61 59 73 76 94  BILITOT 2.3* 2.2* 2.2* 1.4* 0.9  PROT 6.1 6.1 6.2 5.9* 5.4*  ALBUMIN 2.5* 2.4* 2.4* 2.3* 2.2*    Recent Labs Lab 08/05/14 2344  LIPASE 26    Recent Labs Lab 08/05/14 2155 08/06/14 0256 08/08/14 0554 08/09/14 0402 08/10/14 0214  AMMONIA 86* 85* 75* 53* 62*   CBC:  Recent Labs Lab 08/07/14 0236 08/07/14 1700 08/08/14 0554 08/09/14 0402 08/10/14 0214 08/11/14 0640  WBC 6.7  --  4.8 4.1 3.7* 4.3  NEUTROABS 5.1  --  3.1 2.4 1.9 2.7  HGB 10.9* 11.6* 10.9* 10.8* 10.3* 10.2*  HCT 33.3*  --  32.7* 32.4* 31.2* 30.9*  MCV 94.6  --  93.2 92.8 93.7 93.4  PLT 33*  --  39* 43* 45* 50*   CBG:  Recent Labs Lab 08/10/14 1110 08/10/14 1717 08/10/14 2116 08/11/14 0808 08/11/14 1231  GLUCAP 175* 154* 195* 111* 142*    Recent Results (from the past 240 hour(s))  Urine culture     Status: None   Collection Time: 08/05/14 10:11 PM  Result Value Ref Range Status   Specimen Description URINE, CLEAN CATCH  Final   Special Requests ADDED 0004 08/06/14  Final   Colony Count   Final    >=100,000 COLONIES/ML Performed at [facility]    Culture   Final    METHICILLIN RESISTANT STAPHYLOCOCCUS AUREUS Note: RIFAMPIN AND GENTAMICIN SHOULD NOT BE USED AS SINGLE DRUGS FOR TREATMENT OF STAPH INFECTIONS.  CRITICAL RESULT CALLED TO, READ BACK BY AND VERIFIED WITH: LEE WORLEY AT 3:04 A.M. ON 08/08/2014 WARRB Performed at [facility]    Report Status 08/08/2014 FINAL  Final   Organism ID, Bacteria METHICILLIN RESISTANT STAPHYLOCOCCUS AUREUS  Final      Susceptibility   Methicillin resistant staphylococcus aureus - MIC*    GENTAMICIN <=0.5 SENSITIVE Sensitive     LEVOFLOXACIN 4 INTERMEDIATE Intermediate     NITROFURANTOIN <=16 SENSITIVE Sensitive  OXACILLIN >=4 RESISTANT Resistant     PENICILLIN >=0.5 RESISTANT Resistant     RIFAMPIN <=0.5 SENSITIVE Sensitive     TRIMETH/SULFA <=10 SENSITIVE Sensitive     VANCOMYCIN 1 SENSITIVE Sensitive     TETRACYCLINE <=1 SENSITIVE Sensitive     * METHICILLIN RESISTANT STAPHYLOCOCCUS AUREUS  Blood Culture (routine x 2)     Status: None   Collection Time: 08/05/14 11:52 PM  Result Value Ref Range Status   Specimen Description BLOOD LEFT ANTECUBITAL  Final   Special Requests BOTTLES DRAWN AEROBIC AND ANAEROBIC 5CC EA  Final   Culture   Final    STAPHYLOCOCCUS AUREUS Note: RIFAMPIN AND GENTAMICIN SHOULD NOT BE USED AS SINGLE DRUGS FOR TREATMENT OF STAPH INFECTIONS. SUSCEPTIBILITIES PERFORMED ON PREVIOUS CULTURE WITHIN THE LAST 5 DAYS. Note: Gram Stain Report Called to,Read Back By and Verified With: HEATHER RICHARD 08/06/14 AT 2355 RIDK Performed at [facility]    Report Status 08/09/2014 FINAL  Final  Blood Culture (routine x 2)     Status: None   Collection Time: 08/05/14 11:55 PM  Result Value Ref Range Status   Specimen Description BLOOD LEFT WRIST  Final   Special Requests BOTTLES DRAWN AEROBIC AND ANAEROBIC 5CC EA  Final   Culture   Final    METHICILLIN RESISTANT STAPHYLOCOCCUS AUREUS Note: RIFAMPIN AND GENTAMICIN SHOULD NOT BE USED AS SINGLE DRUGS FOR TREATMENT OF STAPH INFECTIONS. This organism DOES NOT demonstrate inducible Clindamycin resistance in vitro. CRITICAL RESULT CALLED TO, READ BACK BY AND VERIFIED WITH: CAROL  SCHILLER @  3:28 PM 08/08/14 BY DWEEKS Note: Gram Stain Report Called to,Read Back By and Verified With: HEATHER RICHARD 07/26/14 AT 2355 RIDK Performed at [facility]    Report Status 08/09/2014 FINAL  Final   Organism ID, Bacteria METHICILLIN RESISTANT STAPHYLOCOCCUS AUREUS  Final      Susceptibility   Methicillin resistant staphylococcus aureus - MIC*    CLINDAMYCIN <=0.25 SENSITIVE Sensitive     ERYTHROMYCIN >=8 RESISTANT Resistant     GENTAMICIN <=0.5 SENSITIVE Sensitive     LEVOFLOXACIN 4 INTERMEDIATE Intermediate     OXACILLIN >=4 RESISTANT Resistant     PENICILLIN >=0.5 RESISTANT Resistant     RIFAMPIN <=0.5 SENSITIVE Sensitive     TRIMETH/SULFA <=10 SENSITIVE Sensitive     VANCOMYCIN 1 SENSITIVE Sensitive     TETRACYCLINE <=1 SENSITIVE Sensitive     * METHICILLIN RESISTANT STAPHYLOCOCCUS AUREUS  MRSA PCR Screening     Status: Abnormal   Collection Time: 08/06/14  4:00 AM  Result Value Ref Range Status   MRSA by PCR POSITIVE (A) NEGATIVE Final  Culture, blood (routine x 2)     Status: None (Preliminary result)   Collection Time: 08/08/14  3:00 PM  Result Value Ref Range Status   Specimen Description BLOOD LEFT HAND  Final   Special Requests BOTTLES DRAWN AEROBIC ONLY 3CC  Final   Culture   Final           BLOOD CULTURE RECEIVED NO GROWTH TO DATE CULTURE WILL BE HELD FOR 5 DAYS BEFORE ISSUING A FINAL NEGATIVE REPORT Performed at [facility]    Report Status PENDING  Incomplete  Culture, blood (routine x 2)     Status: None (Preliminary result)   Collection Time: 08/08/14  3:15 PM  Result Value Ref Range Status   Specimen Description BLOOD LEFT HAND  Final   Special Requests BOTTLES DRAWN AEROBIC ONLY 3CC  Final   Culture  Final           BLOOD CULTURE RECEIVED NO GROWTH TO DATE CULTURE WILL BE HELD FOR 5 DAYS BEFORE ISSUING A FINAL NEGATIVE REPORT Performed at [facility]    Report Status PENDING  Incomplete     Scheduled Meds: .  antiseptic oral rinse  7 mL Mouth Rinse q12n4p  . chlorhexidine  15 mL Mouth Rinse BID  . lactulose  20 g Oral TID  . metoprolol tartrate  25 mg Oral BID  . phosphorus  250 mg Oral TID  . terazosin  4 mg Oral QHS  . vancomycin  1,250 mg Intravenous 3 times per day   Continuous Infusions: . sodium chloride 75 mL/hr at 08/11/14 1241  . sodium chloride

## 2014-08-12 DIAGNOSIS — A4102 Sepsis due to Methicillin resistant Staphylococcus aureus: Principal | ICD-10-CM

## 2014-08-12 DIAGNOSIS — K7290 Hepatic failure, unspecified without coma: Secondary | ICD-10-CM

## 2014-08-12 LAB — CBC WITH DIFFERENTIAL/PLATELET
Basophils Absolute: 0 10*3/uL (ref 0.0–0.1)
Basophils Relative: 1 % (ref 0–1)
EOS ABS: 0.1 10*3/uL (ref 0.0–0.7)
Eosinophils Relative: 3 % (ref 0–5)
HCT: 32.8 % — ABNORMAL LOW (ref 39.0–52.0)
HEMOGLOBIN: 10.8 g/dL — AB (ref 13.0–17.0)
Lymphocytes Relative: 18 % (ref 12–46)
Lymphs Abs: 0.8 10*3/uL (ref 0.7–4.0)
MCH: 31 pg (ref 26.0–34.0)
MCHC: 32.9 g/dL (ref 30.0–36.0)
MCV: 94.3 fL (ref 78.0–100.0)
MONOS PCT: 12 % (ref 3–12)
Monocytes Absolute: 0.5 10*3/uL (ref 0.1–1.0)
NEUTROS ABS: 2.8 10*3/uL (ref 1.7–7.7)
NEUTROS PCT: 66 % (ref 43–77)
PLATELETS: 53 10*3/uL — AB (ref 150–400)
RBC: 3.48 MIL/uL — ABNORMAL LOW (ref 4.22–5.81)
RDW: 15.5 % (ref 11.5–15.5)
WBC: 4.2 10*3/uL (ref 4.0–10.5)

## 2014-08-12 LAB — BASIC METABOLIC PANEL
Anion gap: 8 (ref 5–15)
BUN: 6 mg/dL (ref 6–23)
CALCIUM: 8.7 mg/dL (ref 8.4–10.5)
CO2: 22 mmol/L (ref 19–32)
CREATININE: 0.68 mg/dL (ref 0.50–1.35)
Chloride: 109 mmol/L (ref 96–112)
GFR calc Af Amer: >90 mL/min (ref >90)
GFR calc non Af Amer: >90 mL/min (ref >90)
GLUCOSE: 100 mg/dL — AB (ref 70–99)
Potassium: 4.2 mmol/L (ref 3.5–5.1)
Sodium: 139 mmol/L (ref 135–145)

## 2014-08-12 LAB — GLUCOSE, CAPILLARY
GLUCOSE-CAPILLARY: 107 mg/dL — AB (ref 70–99)
GLUCOSE-CAPILLARY: 104 mg/dL — AB (ref 70–99)
Glucose-Capillary: 146 mg/dL — ABNORMAL HIGH (ref 70–99)
Glucose-Capillary: 216 mg/dL — ABNORMAL HIGH (ref 70–99)

## 2014-08-12 LAB — VANCOMYCIN, TROUGH: Vancomycin Tr: 22.6 ug/mL — ABNORMAL HIGH (ref 10.0–20.0)

## 2014-08-12 LAB — MAGNESIUM: Magnesium: 1.9 mg/dL (ref 1.5–2.5)

## 2014-08-12 LAB — AMMONIA: Ammonia: 59 umol/L — ABNORMAL HIGH (ref 11–32)

## 2014-08-12 MED ORDER — VANCOMYCIN HCL IN DEXTROSE 1-5 GM/200ML-% IV SOLN
1000.0000 mg | Freq: Three times a day (TID) | INTRAVENOUS | Status: DC
  Administered 2014-08-12 – 2014-08-14 (×5): 1000 mg via INTRAVENOUS
  Filled 2014-08-12 (×7): qty 200

## 2014-08-12 NOTE — Progress Notes (Addendum)
Per Card master, TEE delayed until tomorrow. Platelet 53 this morning, has CBC daily, will reassess platelet level tomorrow before TEE. Currently on schedule for TEE with Dr. Skains at 1500 tomorrow  Signed, Kayleann Mccaffery PA Pager: [phone]   

## 2014-08-12 NOTE — Progress Notes (Signed)
Patient ID: John Santiago, male   DOB: [birth year], 54 y.o.   MRN: [id number]         [facility]    Date of Admission:  08/05/2014           Day 7 vancomycin  Principal Problem:   Bacteremia due to methicillin resistant Staphylococcus aureus Active Problems:   Sepsis   Altered mental status   UTI (lower urinary tract infection)   Hematuria   Thrombocytopenia   Cigarette smoker   Normocytic anemia   Depression   Diabetes mellitus without complication   Hyperlipidemia   Hypertension   Urinary tract infectious disease   Nephrolithiasis   Hypomagnesemia   Hypokalemia   Hyponatremia   Increased ammonia level   Hypophosphatemia   MRSA bacteremia   . antiseptic oral rinse  7 mL Mouth Rinse q12n4p  . chlorhexidine  15 mL Mouth Rinse BID  . lactulose  20 g Oral TID  . metoprolol tartrate  25 mg Oral BID  . phosphorus  250 mg Oral TID  . terazosin  4 mg Oral QHS  . vancomycin  1,000 mg Intravenous 3 times per day   OBJECTIVE: Blood pressure 134/76, pulse 81, temperature 97.9 F (36.6 C), temperature source Oral, resp. rate 18, height 5\' 7"  (1.702 m), weight 187 lb 9.8 oz (85.1 kg), SpO2 95 %.  Lab Results Lab Results  Component Value Date   WBC 4.2 08/12/2014   HGB 10.8* 08/12/2014   HCT 32.8* 08/12/2014   MCV 94.3 08/12/2014   PLT 53* 08/12/2014    Lab Results  Component Value Date   CREATININE 0.68 08/12/2014   BUN 6 08/12/2014   NA 139 08/12/2014   K 4.2 08/12/2014   CL 109 08/12/2014   CO2 22 08/12/2014    Lab Results  Component Value Date   ALT 31 08/11/2014   AST 59* 08/11/2014   ALKPHOS 94 08/11/2014   BILITOT 0.9 08/11/2014     Microbiology: Recent Results (from the past 240 hour(s))  Urine culture     Status: None   Collection Time: 08/05/14 10:11 PM  Result Value Ref Range Status   Specimen Description URINE, CLEAN CATCH  Final   Special Requests ADDED 0004 08/06/14  Final   Colony Count   Final    >=100,000  COLONIES/ML Performed at [facility]    Culture   Final    METHICILLIN RESISTANT STAPHYLOCOCCUS AUREUS Note: RIFAMPIN AND GENTAMICIN SHOULD NOT BE USED AS SINGLE DRUGS FOR TREATMENT OF STAPH INFECTIONS. CRITICAL RESULT CALLED TO, READ BACK BY AND VERIFIED WITH: LEE WORLEY AT 3:04 A.M. ON 08/08/2014 WARRB Performed at [facility]    Report Status 08/08/2014 FINAL  Final   Organism ID, Bacteria METHICILLIN RESISTANT STAPHYLOCOCCUS AUREUS  Final      Susceptibility   Methicillin resistant staphylococcus aureus - MIC*    GENTAMICIN <=0.5 SENSITIVE Sensitive     LEVOFLOXACIN 4 INTERMEDIATE Intermediate     NITROFURANTOIN <=16 SENSITIVE Sensitive     OXACILLIN >=4 RESISTANT Resistant     PENICILLIN >=0.5 RESISTANT Resistant     RIFAMPIN <=0.5 SENSITIVE Sensitive     TRIMETH/SULFA <=10 SENSITIVE Sensitive     VANCOMYCIN 1 SENSITIVE Sensitive     TETRACYCLINE <=1 SENSITIVE Sensitive     * METHICILLIN RESISTANT STAPHYLOCOCCUS AUREUS  Blood Culture (routine x 2)     Status: None   Collection Time: 08/05/14 11:52 PM  Result Value Ref Range Status  Specimen Description BLOOD LEFT ANTECUBITAL  Final   Special Requests BOTTLES DRAWN AEROBIC AND ANAEROBIC 5CC EA  Final   Culture   Final    STAPHYLOCOCCUS AUREUS Note: RIFAMPIN AND GENTAMICIN SHOULD NOT BE USED AS SINGLE DRUGS FOR TREATMENT OF STAPH INFECTIONS. SUSCEPTIBILITIES PERFORMED ON PREVIOUS CULTURE WITHIN THE LAST 5 DAYS. Note: Gram Stain Report Called to,Read Back By and Verified With: HEATHER RICHARD 08/06/14 AT 2355 RIDK Performed at [facility]    Report Status 08/09/2014 FINAL  Final  Blood Culture (routine x 2)     Status: None   Collection Time: 08/05/14 11:55 PM  Result Value Ref Range Status   Specimen Description BLOOD LEFT WRIST  Final   Special Requests BOTTLES DRAWN AEROBIC AND ANAEROBIC 5CC EA  Final   Culture   Final    METHICILLIN RESISTANT STAPHYLOCOCCUS AUREUS Note: RIFAMPIN AND GENTAMICIN  SHOULD NOT BE USED AS SINGLE DRUGS FOR TREATMENT OF STAPH INFECTIONS. This organism DOES NOT demonstrate inducible Clindamycin resistance in vitro. CRITICAL RESULT CALLED TO, READ BACK BY AND VERIFIED WITH: CAROL SCHILLER @  3:28 PM 08/08/14 BY DWEEKS Note: Gram Stain Report Called to,Read Back By and Verified With: HEATHER RICHARD 07/26/14 AT 2355 RIDK Performed at [facility]    Report Status 08/09/2014 FINAL  Final   Organism ID, Bacteria METHICILLIN RESISTANT STAPHYLOCOCCUS AUREUS  Final      Susceptibility   Methicillin resistant staphylococcus aureus - MIC*    CLINDAMYCIN <=0.25 SENSITIVE Sensitive     ERYTHROMYCIN >=8 RESISTANT Resistant     GENTAMICIN <=0.5 SENSITIVE Sensitive     LEVOFLOXACIN 4 INTERMEDIATE Intermediate     OXACILLIN >=4 RESISTANT Resistant     PENICILLIN >=0.5 RESISTANT Resistant     RIFAMPIN <=0.5 SENSITIVE Sensitive     TRIMETH/SULFA <=10 SENSITIVE Sensitive     VANCOMYCIN 1 SENSITIVE Sensitive     TETRACYCLINE <=1 SENSITIVE Sensitive     * METHICILLIN RESISTANT STAPHYLOCOCCUS AUREUS  MRSA PCR Screening     Status: Abnormal   Collection Time: 08/06/14  4:00 AM  Result Value Ref Range Status   MRSA by PCR POSITIVE (A) NEGATIVE Final    Comment:        The GeneXpert MRSA Assay (FDA approved for NASAL specimens only), is one component of a comprehensive MRSA colonization surveillance program. It is not intended to diagnose MRSA infection nor to guide or monitor treatment for MRSA infections. RESULT CALLED TO, READ BACK BY AND VERIFIED WITH: J.LINDSAY,RN 08/06/14 0831 BY BSLADE   Culture, blood (routine x 2)     Status: None (Preliminary result)   Collection Time: 08/08/14  3:00 PM  Result Value Ref Range Status   Specimen Description BLOOD LEFT HAND  Final   Special Requests BOTTLES DRAWN AEROBIC ONLY 3CC  Final   Culture   Final           BLOOD CULTURE RECEIVED NO GROWTH TO DATE CULTURE WILL BE HELD FOR 5 DAYS BEFORE ISSUING A FINAL NEGATIVE  REPORT Performed at [facility]    Report Status PENDING  Incomplete  Culture, blood (routine x 2)     Status: None (Preliminary result)   Collection Time: 08/08/14  3:15 PM  Result Value Ref Range Status   Specimen Description BLOOD LEFT HAND  Final   Special Requests BOTTLES DRAWN AEROBIC ONLY 3CC  Final   Culture   Final           BLOOD CULTURE RECEIVED NO  GROWTH TO DATE CULTURE WILL BE HELD FOR 5 DAYS BEFORE ISSUING A FINAL NEGATIVE REPORT Performed at [facility]    Report Status PENDING  Incomplete    Assessment: He is improving on therapy for MRSA bacteremia complicating probable pyelonephritis. If TEE today does not show any evidence of endocarditis I would plan on a total of 3 weeks of antibiotic therapy.  Plan: 1. Continue vancomycin 2. Await results of TEE  Cliffton Asters, MD [facility] [phone] pager   [phone] cell 08/12/2014, 1:21 PM

## 2014-08-12 NOTE — Progress Notes (Signed)
08/12/14 Anticipate home IV antibiotics, spoke with patient, he stated that he lives with his wife and that she has given IV antibiotics to herself previously and that he is willing to learn as well. I [place] [facility] [place] at [facility], patient's PCP is Dr. Milana Obeyyers and contact [facility] is Chana BodeKeona [phone] ext [phone] or Victorino DikeJennifer ext [phone]. Left voicemail for [place]. Will continue to follow.

## 2014-08-12 NOTE — Progress Notes (Addendum)
Patient ID: John Santiago, male   DOB: 1960-10-13, 54 y.o.   MRN: [id number] [facility] PROGRESS NOTE  John Santiago MAU:[id number] DOB: 1961-05-06 DOA: 08/05/2014 PCP: Santiago primary care provider on file.  Brief narrative:    54 y.o. male, with Santiago significant past medical history presented with altered mental status. Upon arrival to the ED, initial concern for stroke but CT and MRI brain with Santiago evidence of ischemia. In addition, pt has had some hematuria and was apparently treated for UTI about one month prior to this admission at the [facility] hospital. Pt also reported drinking alcohol and ammonia level was noted to be elevated, lactic acid > 5.  Hospital course is complicated with finding of MRSA bacteremia, MRSA urinary tract infection. Infectious disease consulted. Cardiology on board for TEE.  Assessment/Plan:    Principal problem: Sepsis secondary to MRSA bacteremia with pyelonephritis / MRSA urinary tract infection - Sepsis criteria met on the admission. Patient is on vancomycin for MRSA bacteremia and MRSA UTI. - Blood cultures on the admission grew MRSA. Repeat blood cultures show Santiago growth to date. - Continue vancomycin. - Appreciate infectious disease following. Order placed for PICC line for prolonged IV antibiotic need. - Cardiology consulted for TEE. 2-D echo showed Santiago acute findings.  Active problems: Recurrent UTI - L non obstructing renal stone  - Continue vancomycin  - As noted, urine culture growing MRSA.  Hematuria - Likely secondary to sepsis and urinary tract infection. - Santiago further episodes of hematuria noted. - Hemoglobin is 10.8.  Increased ammonia level /  - Normal liver echogenicity on [place] - concerning for possible EtOH abuse  - Ammonia level on 08/10/2014 was 62. Ammonia level is pending this morning. - Continue lactulose 20 g by mouth 3 times a day.  Thrombocytopenia  - Likely secondary to bone marrow suppression from alcohol abuse - Platelet count 53 this  morning. - Continue to monitor CBC daily.  Anemia of chronic disease, alcohol induced bone marrow damage - Secondary to bone marrow suppression from alcohol abuse. - Hemoglobin 10.8. - Continue to monitor CBC.  Essential hypertension - Continue metoprolol 25 mg twice a day  Hypomagnesemia - WNL  Hypokalemia  - Supplemented and WNL   Hyponatremia  - Sodium normalized with IV fluids.  Hypophosphatemia - K-Phos 250 mg BID for 2 doses - Phosphorus WNL  DVT prophylaxis  - SCD's bilaterally  Code Status: Full.  Family Communication: plan of care discussed with the patient Disposition Plan: Plan for TEE this morning. Not stable for discharge at this time.   IV access:  Peripheral IV  Procedures and diagnostic studies:    Ct Head Wo Contrast 08/05/2014 Santiago CT findings for an acute intracranial process.   Mr Brain Wo Contrast 08/05/2014 Santiago evidence of acute ischemia.   US Abdomen Complete 08/06/2014 Left renal stone, Santiago obstruction. Splenomegaly. ? caudate lobe hypertrophy.   CXR 08/06/2014 Minimal bibasilar atelectasis noted.   Medical Consultants:  Cardiology ID   Other Consultants:  None   IAnti-Infectives:   Ceftriaxone 3/21 1 dose Zosyn 3/21 --> stopped 3/22 Cefepime 3/22 --> stopped 3/23 Vanc 3/22 -->   John Santiago, Dedra Skeens, MD  [facility] Pager [phone]  If 7PM-7AM, please contact night-coverage [email or web address] Password [facility] 08/12/2014, 11:26 AM   LOS: 6 days    HPI/Subjective: Santiago acute overnight events.  Objective: Filed Vitals:   08/11/14 2119 08/11/14 2245 08/12/14 0602 08/12/14 1103  BP: 123/67 139/72 117/72 134/76  Pulse: 84 80  81  Temp:  98.2 F (36.8 C)  97.9 F (36.6 C)   TempSrc: Oral  Oral   Resp: 18  18   Height:      Weight:      SpO2: 97%  95%     Intake/Output Summary (Last 24 hours) at 08/12/14 1126 Last data filed at 08/12/14 1030  Gross per 24 hour  Intake 4177.5 ml  Output   2250 ml  Net 1927.5 ml     Exam:   General:  Pt is not in acute distress  Cardiovascular: Regular rate and rhythm, S1/S2 (+)  Respiratory: Santiago wheezing, Santiago crackles, Santiago rhonchi  Abdomen: Soft, non tender, non distended, bowel sounds present  Extremities: Santiago edema, pulses DP and PT palpable bilaterally  Neuro: Grossly nonfocal  Data Reviewed: Basic Metabolic Panel:  Recent Labs Lab 08/06/14 0849  08/08/14 0554 08/09/14 0402 08/10/14 0214 08/11/14 0640 08/12/14 0928  NA 137  < > 132* 134* 137 137 139  K 3.5  < > 3.3* 3.6 3.5 3.7 4.2  CL 105  < > 105 107 110 108 109  CO2 22  < > 21 20 23 21 22   GLUCOSE 146*  < > 106* 118* 94 126* 100*  BUN 10  < > 9 8 7 6 6   CREATININE 0.90  < > 0.65 0.61 0.64 0.61 0.68  CALCIUM 8.2*  < > 7.9* 8.0* 8.0* 8.4 8.7  MG 1.4*  --  2.0  --   --  1.7 1.9  PHOS  --   --  1.8* 1.5* 2.7  --   --   < > = values in this interval not displayed. Liver Function Tests:  Recent Labs Lab 08/07/14 0236 08/08/14 0554 08/09/14 0402 08/10/14 0214 08/11/14 0640  AST 31 33 72* 59* 59*  ALT 19 18 29 29 31   ALKPHOS 61 59 73 76 94  BILITOT 2.3* 2.2* 2.2* 1.4* 0.9  PROT 6.1 6.1 6.2 5.9* 5.4*  ALBUMIN 2.5* 2.4* 2.4* 2.3* 2.2*    Recent Labs Lab 08/05/14 2344  LIPASE 26    Recent Labs Lab 08/05/14 2155 08/06/14 0256 08/08/14 0554 08/09/14 0402 08/10/14 0214  AMMONIA 86* 85* 75* 53* 62*   CBC:  Recent Labs Lab 08/08/14 0554 08/09/14 0402 08/10/14 0214 08/11/14 0640 08/12/14 0928  WBC 4.8 4.1 3.7* 4.3 4.2  NEUTROABS 3.1 2.4 1.9 2.7 2.8  HGB 10.9* 10.8* 10.3* 10.2* 10.8*  HCT 32.7* 32.4* 31.2* 30.9* 32.8*  MCV 93.2 92.8 93.7 93.4 94.3  PLT 39* 43* 45* 50* 53*   Cardiac Enzymes: Santiago results for input(s): CKTOTAL, CKMB, CKMBINDEX, TROPONINI in the last 168 hours. BNP: Invalid input(s): POCBNP CBG:  Recent Labs Lab 08/11/14 0808 08/11/14 1231 08/11/14 1635 08/11/14 2224 08/12/14 0823  GLUCAP 111* 142* 155* 113* 107*    Urine culture     Status:  None   Collection Time: 08/05/14 10:11 PM  Result Value Ref Range Status   Specimen Description URINE, CLEAN CATCH  Final   Special Requests ADDED 0004 08/06/14  Final   Colony Count   Final   Culture   Final    METHICILLIN RESISTANT STAPHYLOCOCCUS AUREUS    Report Status 08/08/2014 FINAL  Final      Susceptibility   Methicillin resistant staphylococcus aureus - MIC*    GENTAMICIN <=0.5 SENSITIVE Sensitive     LEVOFLOXACIN 4 INTERMEDIATE Intermediate     NITROFURANTOIN <=16 SENSITIVE Sensitive     OXACILLIN >=4 RESISTANT Resistant  PENICILLIN >=0.5 RESISTANT Resistant     RIFAMPIN <=0.5 SENSITIVE Sensitive     TRIMETH/SULFA <=10 SENSITIVE Sensitive     VANCOMYCIN 1 SENSITIVE Sensitive     TETRACYCLINE <=1 SENSITIVE Sensitive     * METHICILLIN RESISTANT STAPHYLOCOCCUS AUREUS  Blood Culture (routine x 2)     Status: None   Collection Time: 08/05/14 11:52 PM  Result Value Ref Range Status   Specimen Description BLOOD LEFT ANTECUBITAL  Final   Special Requests BOTTLES DRAWN AEROBIC AND ANAEROBIC 5CC EA  Final   Culture   Final    STAPHYLOCOCCUS AUREUS    Report Status 08/09/2014 FINAL  Final  Blood Culture (routine x 2)     Status: None   Collection Time: 08/05/14 11:55 PM  Result Value Ref Range Status   Specimen Description BLOOD LEFT WRIST  Final   Culture   Final    METHICILLIN RESISTANT STAPHYLOCOCCUS AUREUS    Report Status 08/09/2014 FINAL  Final      Susceptibility   Methicillin resistant staphylococcus aureus - MIC*    CLINDAMYCIN <=0.25 SENSITIVE Sensitive     ERYTHROMYCIN >=8 RESISTANT Resistant     GENTAMICIN <=0.5 SENSITIVE Sensitive     LEVOFLOXACIN 4 INTERMEDIATE Intermediate     OXACILLIN >=4 RESISTANT Resistant     PENICILLIN >=0.5 RESISTANT Resistant     RIFAMPIN <=0.5 SENSITIVE Sensitive     TRIMETH/SULFA <=10 SENSITIVE Sensitive     VANCOMYCIN 1 SENSITIVE Sensitive     TETRACYCLINE <=1 SENSITIVE Sensitive     * METHICILLIN RESISTANT STAPHYLOCOCCUS  AUREUS  MRSA PCR Screening     Status: Abnormal   Collection Time: 08/06/14  4:00 AM  Result Value Ref Range Status   MRSA by PCR POSITIVE (A) NEGATIVE Final  Culture, blood (routine x 2)     Status: None (Preliminary result)   Collection Time: 08/08/14  3:00 PM  Result Value Ref Range Status   Specimen Description BLOOD LEFT HAND  Final   Culture   Final           BLOOD CULTURE RECEIVED Santiago GROWTH TO DATE    Report Status PENDING  Incomplete  Culture, blood (routine x 2)     Status: None (Preliminary result)   Collection Time: 08/08/14  3:15 PM  Result Value Ref Range Status   Specimen Description BLOOD LEFT HAND  Final   Culture   Final           BLOOD CULTURE RECEIVED Santiago GROWTH TO DATE     Report Status PENDING  Incomplete     Scheduled Meds: . antiseptic oral rinse  7 mL Mouth Rinse q12n4p  . chlorhexidine  15 mL Mouth Rinse BID  . lactulose  20 g Oral TID  . metoprolol tartrate  25 mg Oral BID  . phosphorus  250 mg Oral TID  . terazosin  4 mg Oral QHS  . vancomycin  1,250 mg Intravenous 3 times per day   Continuous Infusions: . sodium chloride 1 mL (08/12/14 0229)  . sodium chloride

## 2014-08-12 NOTE — Progress Notes (Signed)
ANTIBIOTIC CONSULT NOTE - FOLLOW UP  Pharmacy Consult for Vancomycin Indication: MRSA bacteremia/UTI  No Known Allergies  Patient Measurements: Height: 5\' 7"  (170.2 cm) Weight: 187 lb 9.8 oz (85.1 kg) IBW/kg (Calculated) : 66.1 Adjusted Body Weight:   Vital Signs: Temp: 97.9 F (36.6 C) (03/28 0602) Temp Source: Oral (03/28 0602) BP: 134/76 mmHg (03/28 1103) Pulse Rate: 81 (03/28 1103) Intake/Output from previous day: 03/27 0701 - 03/28 0700 In: 4417.5 [P.O.:820; I.V.:1847.5; IV Piggyback:1750] Out: 1850 [Urine:1850] Intake/Output from this shift: Total I/O In: 0  Out: 400 [Urine:400]  Labs:  Recent Labs  08/10/14 0214 08/11/14 0640 08/12/14 0928  WBC 3.7* 4.3 4.2  HGB 10.3* 10.2* 10.8*  PLT 45* 50* 53*  CREATININE 0.64 0.61 0.68   Estimated Creatinine Clearance: 108.8 mL/min (by C-G formula based on Cr of 0.68).  Recent Labs  08/10/14 1600 08/12/14 0928  VANCOTROUGH 19.9 22.6*     Assessment: 54 y.o. M admitted 08/05/2014 with AMS, found to have MRSA bacteremia/UTI. Pharmacy consulted to dose vancomycin.    Coag/Heme: INR 1.66 on admission, thrombocytopenia, hepatic workup in progress. SCDs for VTE Px  Infectious Disease - MRSA bacteremia/UTI. ID following. TTE neg for vegetation - needs TEE (rescheduled for Monday). Afeb. WBC wnl. Scr 0.68  CTX 2gm (1gm x 2) on 3/21 Zosyn x 1 3/22 Maxipime 3/21>> 3/23  Vanc 3/22>> - 3/24: VT 13.7 on 1gm IV q8h - inc to 1250mg  IV q8  - 03/26: VT 19.9 on 1250 mg iv q8h - 3/28: VT 22.6  3/21 bld x 2 - MRSA 3/22 urine - > 100K MRSA (Vanc MIC 1) 3/22 MRSA PCR + 3/24 Bld x2 - pending  Cardiovascular - HTN,ECHO EF 55-60%. VSS on Metoprolol, terazosin  Endocrinology - CBGs <160. SSI. TSH ok  Gastrointestinal / Nutrition: LFTs now nl. NH3 remains elevated on lactulose  Neurology - AMS PTA. Code Stroke called 3/21 pm, but CT & MRI neg, no tPA. EEG neg, neuro s/o. A&O.   Nephrology- Scr stable. CrCl > 100 ml/min  Phos repleated 2.7 but remains on KPhos neutral, SCr 0.61  Pulmonary - RA  PTA Medication Issues: not resumed: ASA 325, buproprion, metformin, statin  Best Practices: SCDs, MC  Goal of Therapy:  Vancomycin trough level 15-20 mcg/ml  Plan:  Decrease Vancomycin back to 1g IV q8hr.  Issac Moure S. Merilynn Finlandobertson, PharmD, BCPS Clinical Staff Pharmacist Pager [phone]  Misty Stanleyobertson, Jadan Hinojos Stillinger 08/12/2014,12:58 PM

## 2014-08-13 ENCOUNTER — Encounter (HOSPITAL_COMMUNITY): Payer: Self-pay | Admitting: *Deleted

## 2014-08-13 ENCOUNTER — Encounter (HOSPITAL_COMMUNITY)
Admission: EM | Disposition: A | Discharge status: Home / Self Care | Payer: Self-pay | Source: Home / Self Care | Attending: Internal Medicine

## 2014-08-13 DIAGNOSIS — N10 Acute tubulo-interstitial nephritis: Secondary | ICD-10-CM

## 2014-08-13 DIAGNOSIS — I340 Nonrheumatic mitral (valve) insufficiency: Secondary | ICD-10-CM

## 2014-08-13 HISTORY — PX: TEE WITHOUT CARDIOVERSION: SHX5443

## 2014-08-13 LAB — CBC WITH DIFFERENTIAL/PLATELET
Basophils Absolute: 0 10*3/uL (ref 0.0–0.1)
Basophils Relative: 0 % (ref 0–1)
EOS ABS: 0.1 10*3/uL (ref 0.0–0.7)
EOS PCT: 3 % (ref 0–5)
HEMATOCRIT: 31.4 % — AB (ref 39.0–52.0)
HEMOGLOBIN: 10.2 g/dL — AB (ref 13.0–17.0)
LYMPHS ABS: 1 10*3/uL (ref 0.7–4.0)
Lymphocytes Relative: 23 % (ref 12–46)
MCH: 30.7 pg (ref 26.0–34.0)
MCHC: 32.5 g/dL (ref 30.0–36.0)
MCV: 94.6 fL (ref 78.0–100.0)
MONO ABS: 0.5 10*3/uL (ref 0.1–1.0)
MONOS PCT: 10 % (ref 3–12)
Neutro Abs: 2.9 10*3/uL (ref 1.7–7.7)
Neutrophils Relative %: 64 % (ref 43–77)
Platelets: 65 10*3/uL — ABNORMAL LOW (ref 150–400)
RBC: 3.32 MIL/uL — AB (ref 4.22–5.81)
RDW: 15.5 % (ref 11.5–15.5)
WBC: 4.5 10*3/uL (ref 4.0–10.5)

## 2014-08-13 LAB — GLUCOSE, CAPILLARY
GLUCOSE-CAPILLARY: 93 mg/dL (ref 70–99)
GLUCOSE-CAPILLARY: 106 mg/dL — AB (ref 70–99)
GLUCOSE-CAPILLARY: 184 mg/dL — AB (ref 70–99)
Glucose-Capillary: 103 mg/dL — ABNORMAL HIGH (ref 70–99)

## 2014-08-13 LAB — MAGNESIUM: Magnesium: 1.8 mg/dL (ref 1.5–2.5)

## 2014-08-13 SURGERY — ECHOCARDIOGRAM, TRANSESOPHAGEAL
Anesthesia: Moderate Sedation

## 2014-08-13 MED ORDER — SODIUM CHLORIDE 0.9 % IJ SOLN: 10.0000 mL | INTRAMUSCULAR | Status: DC | PRN

## 2014-08-13 MED ORDER — FENTANYL CITRATE 0.05 MG/ML IJ SOLN
INTRAMUSCULAR | Status: DC | PRN
  Administered 2014-08-13 (×2): 25 ug via INTRAVENOUS

## 2014-08-13 MED ORDER — CHLORHEXIDINE GLUCONATE CLOTH 2 % EX PADS
6.0000 | MEDICATED_PAD | Freq: Every day | CUTANEOUS | Status: DC
  Administered 2014-08-14: 6 via TOPICAL

## 2014-08-13 MED ORDER — FENTANYL CITRATE 0.05 MG/ML IJ SOLN
INTRAMUSCULAR | Status: AC
  Filled 2014-08-13: qty 2

## 2014-08-13 MED ORDER — VANCOMYCIN HCL IN DEXTROSE 1-5 GM/200ML-% IV SOLN: 1000.0000 mg | Freq: Three times a day (TID) | INTRAVENOUS | Status: DC

## 2014-08-13 MED ORDER — BUTAMBEN-TETRACAINE-BENZOCAINE 2-2-14 % EX AERO
INHALATION_SPRAY | CUTANEOUS | Status: DC | PRN
  Administered 2014-08-13: 2 via TOPICAL

## 2014-08-13 MED ORDER — MIDAZOLAM HCL 10 MG/2ML IJ SOLN
INTRAMUSCULAR | Status: DC | PRN
  Administered 2014-08-13 (×2): 2 mg via INTRAVENOUS

## 2014-08-13 MED ORDER — MIDAZOLAM HCL 5 MG/ML IJ SOLN
INTRAMUSCULAR | Status: AC
  Filled 2014-08-13: qty 2

## 2014-08-13 MED ORDER — MUPIROCIN 2 % EX OINT
1.0000 "application " | TOPICAL_OINTMENT | Freq: Two times a day (BID) | CUTANEOUS | Status: DC
  Administered 2014-08-13 – 2014-08-14 (×3): 1 via NASAL

## 2014-08-13 NOTE — Progress Notes (Signed)
    Platelets have increased. Will proceed with TEE. Increased risk of bleeding.  Donato SchultzSKAINS, Christian Borgerding, MD

## 2014-08-13 NOTE — CV Procedure (Signed)
    TEE  Indications: Bacterial  Findings: Normal TEE  No vegetations  Donato SchultzSKAINS, Rodgerick Gilliand, MD

## 2014-08-13 NOTE — Progress Notes (Signed)
Patient ID: John Santiago, male   DOB: 03-08-1961, 54 y.o.   MRN: [id number]         [facility]    Date of Admission:  08/05/2014           Day 8 vancomycin  Principal Problem:   Bacteremia due to methicillin resistant Staphylococcus aureus Active Problems:   Sepsis   Altered mental status   UTI (lower urinary tract infection)   Hematuria   Thrombocytopenia   Cigarette smoker   Normocytic anemia   Depression   Diabetes mellitus without complication   Hyperlipidemia   Hypertension   Urinary tract infectious disease   Nephrolithiasis   Hypomagnesemia   Hypokalemia   Hyponatremia   Increased ammonia level   Hypophosphatemia   MRSA bacteremia   . antiseptic oral rinse  7 mL Mouth Rinse q12n4p  . chlorhexidine  15 mL Mouth Rinse BID  . [START ON 08/14/2014] Chlorhexidine Gluconate Cloth  6 each Topical Q0600  . lactulose  20 g Oral TID  . metoprolol tartrate  25 mg Oral BID  . mupirocin ointment  1 application Nasal BID  . phosphorus  250 mg Oral TID  . terazosin  4 mg Oral QHS  . vancomycin  1,000 mg Intravenous 3 times per day    Subjective: He is feeling much better.  Review of Systems: Pertinent items are noted in HPI.  Past Medical History  Diagnosis Date  . Depression   . Diabetes mellitus without complication   . Hyperlipidemia   . Hypertension   . Erectile dysfunction   . Fracture of right lower leg     History  Substance Use Topics  . Smoking status: Current Every Day Smoker  . Smokeless tobacco: Not on file  . Alcohol Use: Not on file    History reviewed. No pertinent family history. No Known Allergies  OBJECTIVE: Blood pressure 119/68, pulse 81, temperature 98.6 F (37 C), temperature source Oral, resp. rate 18, height  (1.702 m), weight 187 lb 9.8 oz (85.1 kg), SpO2 100 %. General: he is alert and comfortable Skin: new right arm PICC Lungs: clear Cor: regular S1 and S2 with no murmurs Abdomen: soft and  nontender. No CVA tenderness  Lab Results Lab Results  Component Value Date   WBC 4.5 08/13/2014   HGB 10.2* 08/13/2014   HCT 31.4* 08/13/2014   MCV 94.6 08/13/2014   PLT 65* 08/13/2014    Lab Results  Component Value Date   CREATININE 0.68 08/12/2014   BUN 6 08/12/2014   NA 139 08/12/2014   K 4.2 08/12/2014   CL 109 08/12/2014   CO2 22 08/12/2014    Lab Results  Component Value Date   ALT 31 08/11/2014   AST 59* 08/11/2014   ALKPHOS 94 08/11/2014   BILITOT 0.9 08/11/2014     Microbiology: Recent Results (from the past 240 hour(s))  Urine culture     Status: None   Collection Time: 08/05/14 10:11 PM  Result Value Ref Range Status   Specimen Description URINE, CLEAN CATCH  Final   Special Requests ADDED 0004 08/06/14  Final   Colony Count   Final    >=100,000 COLONIES/ML Performed at [facility]    Culture   Final    METHICILLIN RESISTANT STAPHYLOCOCCUS AUREUS Note: RIFAMPIN AND GENTAMICIN SHOULD NOT BE USED AS SINGLE DRUGS FOR TREATMENT OF STAPH INFECTIONS. CRITICAL RESULT CALLED TO, READ BACK BY AND VERIFIED WITH: LEE WORLEY AT  3:04 A.M. ON 08/08/2014 WARRB Performed at [facility]    Report Status 08/08/2014 FINAL  Final   Organism ID, Bacteria METHICILLIN RESISTANT STAPHYLOCOCCUS AUREUS  Final      Susceptibility   Methicillin resistant staphylococcus aureus - MIC*    GENTAMICIN <=0.5 SENSITIVE Sensitive     LEVOFLOXACIN 4 INTERMEDIATE Intermediate     NITROFURANTOIN <=16 SENSITIVE Sensitive     OXACILLIN >=4 RESISTANT Resistant     PENICILLIN >=0.5 RESISTANT Resistant     RIFAMPIN <=0.5 SENSITIVE Sensitive     TRIMETH/SULFA <=10 SENSITIVE Sensitive     VANCOMYCIN 1 SENSITIVE Sensitive     TETRACYCLINE <=1 SENSITIVE Sensitive     * METHICILLIN RESISTANT STAPHYLOCOCCUS AUREUS  Blood Culture (routine x 2)     Status: None   Collection Time: 08/05/14 11:52 PM  Result Value Ref Range Status   Specimen Description BLOOD LEFT ANTECUBITAL   Final   Special Requests BOTTLES DRAWN AEROBIC AND ANAEROBIC 5CC EA  Final   Culture   Final    STAPHYLOCOCCUS AUREUS Note: RIFAMPIN AND GENTAMICIN SHOULD NOT BE USED AS SINGLE DRUGS FOR TREATMENT OF STAPH INFECTIONS. SUSCEPTIBILITIES PERFORMED ON PREVIOUS CULTURE WITHIN THE LAST 5 DAYS. Note: Gram Stain Report Called to,Read Back By and Verified With: HEATHER RICHARD 08/06/14 AT 2355 RIDK Performed at [facility]    Report Status 08/09/2014 FINAL  Final  Blood Culture (routine x 2)     Status: None   Collection Time: 08/05/14 11:55 PM  Result Value Ref Range Status   Specimen Description BLOOD LEFT WRIST  Final   Special Requests BOTTLES DRAWN AEROBIC AND ANAEROBIC 5CC EA  Final   Culture   Final    METHICILLIN RESISTANT STAPHYLOCOCCUS AUREUS Note: RIFAMPIN AND GENTAMICIN SHOULD NOT BE USED AS SINGLE DRUGS FOR TREATMENT OF STAPH INFECTIONS. This organism DOES NOT demonstrate inducible Clindamycin resistance in vitro. CRITICAL RESULT CALLED TO, READ BACK BY AND VERIFIED WITH: CAROL SCHILLER @  3:28 PM 08/08/14 BY DWEEKS Note: Gram Stain Report Called to,Read Back By and Verified With: HEATHER RICHARD 07/26/14 AT 2355 RIDK Performed at [facility]    Report Status 08/09/2014 FINAL  Final   Organism ID, Bacteria METHICILLIN RESISTANT STAPHYLOCOCCUS AUREUS  Final      Susceptibility   Methicillin resistant staphylococcus aureus - MIC*    CLINDAMYCIN <=0.25 SENSITIVE Sensitive     ERYTHROMYCIN >=8 RESISTANT Resistant     GENTAMICIN <=0.5 SENSITIVE Sensitive     LEVOFLOXACIN 4 INTERMEDIATE Intermediate     OXACILLIN >=4 RESISTANT Resistant     PENICILLIN >=0.5 RESISTANT Resistant     RIFAMPIN <=0.5 SENSITIVE Sensitive     TRIMETH/SULFA <=10 SENSITIVE Sensitive     VANCOMYCIN 1 SENSITIVE Sensitive     TETRACYCLINE <=1 SENSITIVE Sensitive     * METHICILLIN RESISTANT STAPHYLOCOCCUS AUREUS  MRSA PCR Screening     Status: Abnormal   Collection Time: 08/06/14  4:00 AM    Result Value Ref Range Status   MRSA by PCR POSITIVE (A) NEGATIVE Final    Comment:        The GeneXpert MRSA Assay (FDA approved for NASAL specimens only), is one component of a comprehensive MRSA colonization surveillance program. It is not intended to diagnose MRSA infection nor to guide or monitor treatment for MRSA infections. RESULT CALLED TO, READ BACK BY AND VERIFIED WITH: J.LINDSAY,RN 08/06/14 0831 BY BSLADE   Culture, blood (routine x 2)     Status: None (Preliminary  result)   Collection Time: 08/08/14  3:00 PM  Result Value Ref Range Status   Specimen Description BLOOD LEFT HAND  Final   Special Requests BOTTLES DRAWN AEROBIC ONLY 3CC  Final   Culture   Final           BLOOD CULTURE RECEIVED NO GROWTH TO DATE CULTURE WILL BE HELD FOR 5 DAYS BEFORE ISSUING A FINAL NEGATIVE REPORT [facility]formed at [facility]    Report Status PENDING  Incomplete  Culture, blood (routine x 2)     Status: None (Preliminary result)   Collection Time: 08/08/14  3:15 PM  Result Value Ref Range Status   Specimen Description BLOOD LEFT HAND  Final   Special Requests BOTTLES DRAWN AEROBIC ONLY 3CC  Final   Culture   Final           BLOOD CULTURE RECEIVED NO GROWTH TO DATE CULTURE WILL BE HELD FOR 5 DAYS BEFORE ISSUING A FINAL NEGATIVE REPORT Performed at [facility]    Report Status PENDING  Incomplete   Transesophageal echocardiogram 08/13/2014: No evidence of endocarditis  Assessment: He is improving on therapy for MRSA bacteremia complicating pyelonephritis. Repeat blood cultures are negative and he has no evidence of endocarditis. I will treat him with a total of 3 weeks of IV vancomycin.  Plan: 1. Continue vancomycin for 13 more days through 08/26/2014 2. I will arrange followup in our clinic  Cliffton AstersJohn Jacquilyn Seldon, MD [facility] [phone] pager   [phone] cell 08/13/2014, 7:09 PM

## 2014-08-13 NOTE — H&P (View-Only) (Signed)
Per Biomedical scientistCard master, TEE delayed until tomorrow. Platelet 53 this morning, has CBC daily, will reassess platelet level tomorrow before TEE. Currently on schedule for TEE with Dr. Anne FuSkains at [address] tomorrow  Signed, Azalee CourseHao Hatem Cull PA Pager: [phone]

## 2014-08-13 NOTE — Interval H&P Note (Signed)
History and Physical Interval Note:  08/13/2014 10:12 AM  John Santiago  has presented today for surgery, with the diagnosis of POSITIVE BLOOD CULTURES  The various methods of treatment have been discussed with the patient and family. After consideration of risks, benefits and other options for treatment, the patient has consented to  Procedure(s): TRANSESOPHAGEAL ECHOCARDIOGRAM (TEE) (N/A) as a surgical intervention .  The patient's history has been reviewed, patient examined, no change in status, stable for surgery.  I have reviewed the patient's chart and labs.  Questions were answered to the patient's satisfaction.     Anthony Tamburo

## 2014-08-13 NOTE — Progress Notes (Signed)
Peripherally Inserted Central Catheter/Midline Placement  The IV Nurse has discussed with the patient and/or persons authorized to consent for the patient, the purpose of this procedure and the potential benefits and risks involved with this procedure.  The benefits include less needle sticks, lab draws from the catheter and patient may be discharged home with the catheter.  Risks include, but not limited to, infection, bleeding, blood clot (thrombus formation), and puncture of an artery; nerve damage and irregular heat beat.  Alternatives to this procedure were also discussed.  PICC/Midline Placement Documentation        John Santiago, John Santiago 08/13/2014, 6:02 PM

## 2014-08-13 NOTE — Progress Notes (Signed)
08/13/14 [facility]ers office, will need to contact the ID department at [phone] ext [phone] and fax information to [phone]. Approved home IV agencies are [facility], [employer], [facility] and [employer] [employer] [facility](was [facility]). Contacted ID clinic, spoke with Dr. Tana FeltsBajillan, he requested the H and P, ID consult note, and culture results and he will review for approval then let me know if approved. Will  contact one of the [facility] approved home IV agencies once approval received.Faxed requested information, received fax confirmation. Will continue to follow.    08/12/14 Anticipate home IV antibiotics, spoke with patient, he stated that he lives with his wife and that she has given IV antibiotics to herself previously and that he is willing to learn as well. [facility] [place] [facility] [place] [facility], patient's PCP is Dr. Milana Obeyyers and [facility] to [facility] is Chana BodeKeona [phone] ext [phone] or Victorino DikeJennifer ext [phone]. Left voicemail for [facility]. Will continue to follow.

## 2014-08-13 NOTE — Progress Notes (Addendum)
Patient ID: John Santiago, male   DOB: 1960/10/11, 54 y.o.   MRN: [id number] [facility] PROGRESS NOTE  John Santiago VEL:[id number] DOB: 1961-01-07 DOA: 08/05/2014 PCP: No primary care provider on file.  Brief narrative:    53 y.o. male, with no significant past medical history presented with altered mental status. Upon arrival to the ED, initial concern for stroke but CT and MRI brain with no evidence of ischemia. In addition, pt has had some hematuria and was apparently treated for UTI about one month prior to this admission at the [facility] hospital. Pt also reported drinking alcohol and ammonia level was noted to be elevated, lactic acid > 5.  Hospital course is complicated with finding of MRSA bacteremia, MRSA urinary tract infection. Infectious disease consulted. Cardiology on board for TEE.  Assessment/Plan:    Principal problem: Sepsis secondary to MRSA bacteremia with pyelonephritis / MRSA urinary tract infection - Sepsis criteria met on the admission. Patient is on vancomycin for MRSA bacteremia and MRSA UTI. - Blood cultures on the admission grew MRSA. Repeat blood cultures show no growth to date. - Continue vancomycin for total of 3 weeks per ID  PICC line today ,anticipated stop date is 4/11 - Cardiology consulted for TEE. Done today and was negative    Recurrent UTI - L non obstructing renal stone  - Continue vancomycin  - As noted, urine culture growing MRSA.  Hematuria - Likely secondary to sepsis and urinary tract infection. - No further episodes of hematuria noted. - Hemoglobin is 10.2.  Increased ammonia level /  - Normal liver echogenicity on [place] - concerning for possible EtOH abuse  - Ammonia level on 08/10/2014 was 62.>59, will repeat. - Continue lactulose 20 g by mouth 3 times a day.  Thrombocytopenia  - Likely secondary to bone marrow suppression from alcohol abuse - Platelet count 65 this morning. - Continue to monitor CBC daily.  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 - Hemoglobin 10.8. - Continue to monitor CBC.  Essential hypertension - Continue metoprolol 25 mg twice a day  Hypomagnesemia - WNL  Hypokalemia  - Supplemented and WNL   Hyponatremia  - Sodium normalized with IV fluids.  Hypophosphatemia - K-Phos 250 mg BID for 2 doses - Phosphorus WNL  DVT prophylaxis  - SCD's bilaterally  Code Status: Full.  Family Communication: plan of care discussed with the patient Disposition Plan: may DC tomorrow    IV access:  Peripheral IV  Procedures and diagnostic studies:    Ct Head Wo Contrast 08/05/2014 No CT findings for an acute intracranial process.   Mr Brain Wo Contrast 08/05/2014 No evidence of acute ischemia.   US Abdomen Complete 08/06/2014 Left renal stone, no obstruction. Splenomegaly. ? caudate lobe hypertrophy.   CXR 08/06/2014 Minimal bibasilar atelectasis noted.   Medical Consultants:  Cardiology ID   Other Consultants:  None   IAnti-Infectives:   Ceftriaxone 3/21 1 dose Zosyn 3/21 --> stopped 3/22 Cefepime 3/22 --> stopped 3/23 Vanc 3/22 -->   Analayah Brooke, MD  [facility] Pager [phone]  If 7PM-7AM, please contact night-coverage [email or web address] Password [facility] 08/13/2014, 12:35 PM   LOS: 7 days    HPI/Subjective: Stable , no bleeding episodes   Objective: Filed Vitals:   08/13/14 1100 08/13/14 1110 08/13/14 1120 08/13/14 1125  BP: 104/64 124/62 103/67 112/67  Pulse: 84 82 87 82  Temp:      TempSrc:  Resp: 18 19 18 18   Height:      Weight:      SpO2: 96% 94% 96% 94%    Intake/Output Summary (Last 24 hours) at 08/13/14 1235 Last data filed at 08/13/14 1116  Gross per 24 hour  Intake    300 ml  Output   3925 ml  Net  -3625 ml    Exam:   General:  Pt is not in acute distress  Cardiovascular: Regular rate and rhythm, S1/S2 (+)  Respiratory: no wheezing, no crackles,  no rhonchi  Abdomen: Soft, non tender, non distended, bowel sounds present  Extremities: No edema, pulses DP and PT palpable bilaterally  Neuro: Grossly nonfocal  Data Reviewed: Basic Metabolic Panel:  Recent Labs Lab 08/08/14 0554 08/09/14 0402 08/10/14 0214 08/11/14 0640 08/12/14 0928 08/13/14 0524  NA 132* 134* 137 137 139  --   K 3.3* 3.6 3.5 3.7 4.2  --   CL 105 107 110 108 109  --   CO2 21 20 23 21 22   --   GLUCOSE 106* 118* 94 126* 100*  --   BUN 9 8 7 6 6   --   CREATININE 0.65 0.61 0.64 0.61 0.68  --   CALCIUM 7.9* 8.0* 8.0* 8.4 8.7  --   MG 2.0  --   --  1.7 1.9 1.8  PHOS 1.8* 1.5* 2.7  --   --   --    Liver Function Tests:  Recent Labs Lab 08/07/14 0236 08/08/14 0554 08/09/14 0402 08/10/14 0214 08/11/14 0640  AST 31 33 72* 59* 59*  ALT 19 18 29 29 31   ALKPHOS 61 59 73 76 94  BILITOT 2.3* 2.2* 2.2* 1.4* 0.9  PROT 6.1 6.1 6.2 5.9* 5.4*  ALBUMIN 2.5* 2.4* 2.4* 2.3* 2.2*   No results for input(s): LIPASE, AMYLASE in the last 168 hours.  Recent Labs Lab 08/08/14 0554 08/09/14 0402 08/10/14 0214 08/12/14 0928  AMMONIA 75* 53* 62* 59*   CBC:  Recent Labs Lab 08/09/14 0402 08/10/14 0214 08/11/14 0640 08/12/14 0928 08/13/14 0524  WBC 4.1 3.7* 4.3 4.2 4.5  NEUTROABS 2.4 1.9 2.7 2.8 2.9  HGB 10.8* 10.3* 10.2* 10.8* 10.2*  HCT 32.4* 31.2* 30.9* 32.8* 31.4*  MCV 92.8 93.7 93.4 94.3 94.6  PLT 43* 45* 50* 53* 65*   Cardiac Enzymes: No results for input(s): CKTOTAL, CKMB, CKMBINDEX, TROPONINI in the last 168 hours. BNP: Invalid input(s): POCBNP CBG:  Recent Labs Lab 08/12/14 1215 08/12/14 1714 08/12/14 2144 08/13/14 0756 08/13/14 1150  GLUCAP 146* 104* 216* 103* 93    Urine culture     Status: None   Collection Time: 08/05/14 10:11 PM  Result Value Ref Range Status   Specimen Description URINE, CLEAN CATCH  Final   Special Requests ADDED 0004 08/06/14  Final   Colony Count   Final   Culture   Final    METHICILLIN RESISTANT  STAPHYLOCOCCUS AUREUS    Report Status 08/08/2014 FINAL  Final      Susceptibility   Methicillin resistant staphylococcus aureus - MIC*    GENTAMICIN <=0.5 SENSITIVE Sensitive     LEVOFLOXACIN 4 INTERMEDIATE Intermediate     NITROFURANTOIN <=16 SENSITIVE Sensitive     OXACILLIN >=4 RESISTANT Resistant     PENICILLIN >=0.5 RESISTANT Resistant     RIFAMPIN <=0.5 SENSITIVE Sensitive     TRIMETH/SULFA <=10 SENSITIVE Sensitive     VANCOMYCIN 1 SENSITIVE Sensitive     TETRACYCLINE <=1 SENSITIVE Sensitive     *  METHICILLIN RESISTANT STAPHYLOCOCCUS AUREUS  Blood Culture (routine x 2)     Status: None   Collection Time: 08/05/14 11:52 PM  Result Value Ref Range Status   Specimen Description BLOOD LEFT ANTECUBITAL  Final   Special Requests BOTTLES DRAWN AEROBIC AND ANAEROBIC 5CC EA  Final   Culture   Final    STAPHYLOCOCCUS AUREUS    Report Status 08/09/2014 FINAL  Final  Blood Culture (routine x 2)     Status: None   Collection Time: 08/05/14 11:55 PM  Result Value Ref Range Status   Specimen Description BLOOD LEFT WRIST  Final   Culture   Final    METHICILLIN RESISTANT STAPHYLOCOCCUS AUREUS    Report Status 08/09/2014 FINAL  Final      Susceptibility   Methicillin resistant staphylococcus aureus - MIC*    CLINDAMYCIN <=0.25 SENSITIVE Sensitive     ERYTHROMYCIN >=8 RESISTANT Resistant     GENTAMICIN <=0.5 SENSITIVE Sensitive     LEVOFLOXACIN 4 INTERMEDIATE Intermediate     OXACILLIN >=4 RESISTANT Resistant     PENICILLIN >=0.5 RESISTANT Resistant     RIFAMPIN <=0.5 SENSITIVE Sensitive     TRIMETH/SULFA <=10 SENSITIVE Sensitive     VANCOMYCIN 1 SENSITIVE Sensitive     TETRACYCLINE <=1 SENSITIVE Sensitive     * METHICILLIN RESISTANT STAPHYLOCOCCUS AUREUS  MRSA PCR Screening     Status: Abnormal   Collection Time: 08/06/14  4:00 AM  Result Value Ref Range Status   MRSA by PCR POSITIVE (A) NEGATIVE Final  Culture, blood (routine x 2)     Status: None (Preliminary result)    Collection Time: 08/08/14  3:00 PM  Result Value Ref Range Status   Specimen Description BLOOD LEFT HAND  Final   Culture   Final           BLOOD CULTURE RECEIVED NO GROWTH TO DATE    Report Status PENDING  Incomplete  Culture, blood (routine x 2)     Status: None (Preliminary result)   Collection Time: 08/08/14  3:15 PM  Result Value Ref Range Status   Specimen Description BLOOD LEFT HAND  Final   Culture   Final           BLOOD CULTURE RECEIVED NO GROWTH TO DATE     Report Status PENDING  Incomplete     Scheduled Meds: . antiseptic oral rinse  7 mL Mouth Rinse q12n4p  . chlorhexidine  15 mL Mouth Rinse BID  . lactulose  20 g Oral TID  . metoprolol tartrate  25 mg Oral BID  . phosphorus  250 mg Oral TID  . terazosin  4 mg Oral QHS  . vancomycin  1,000 mg Intravenous 3 times per day   Continuous Infusions: . sodium chloride 75 mL/hr at 08/12/14 1949

## 2014-08-13 NOTE — Progress Notes (Signed)
  Echocardiogram Echocardiogram Transesophageal has been performed.  John Santiago, John Santiago 08/13/2014, 11:27 AM

## 2014-08-14 ENCOUNTER — Encounter (HOSPITAL_COMMUNITY): Payer: Self-pay | Admitting: Cardiology

## 2014-08-14 LAB — CBC WITH DIFFERENTIAL/PLATELET
BASOS ABS: 0 10*3/uL (ref 0.0–0.1)
Basophils Relative: 0 % (ref 0–1)
EOS ABS: 0.1 10*3/uL (ref 0.0–0.7)
Eosinophils Relative: 3 % (ref 0–5)
HEMATOCRIT: 30.6 % — AB (ref 39.0–52.0)
HEMOGLOBIN: 10 g/dL — AB (ref 13.0–17.0)
LYMPHS PCT: 19 % (ref 12–46)
Lymphs Abs: 0.9 10*3/uL (ref 0.7–4.0)
MCH: 30.7 pg (ref 26.0–34.0)
MCHC: 32.7 g/dL (ref 30.0–36.0)
MCV: 93.9 fL (ref 78.0–100.0)
Monocytes Absolute: 0.5 10*3/uL (ref 0.1–1.0)
Monocytes Relative: 9 % (ref 3–12)
Neutro Abs: 3.5 10*3/uL (ref 1.7–7.7)
Neutrophils Relative %: 69 % (ref 43–77)
Platelets: 65 10*3/uL — ABNORMAL LOW (ref 150–400)
RBC: 3.26 MIL/uL — ABNORMAL LOW (ref 4.22–5.81)
RDW: 15.5 % (ref 11.5–15.5)
WBC: 5 10*3/uL (ref 4.0–10.5)

## 2014-08-14 LAB — MAGNESIUM: MAGNESIUM: 1.8 mg/dL (ref 1.5–2.5)

## 2014-08-14 LAB — COMPREHENSIVE METABOLIC PANEL
ALK PHOS: 104 U/L (ref 39–117)
ALT: 36 U/L (ref 0–53)
ANION GAP: 4 — AB (ref 5–15)
AST: 73 U/L — ABNORMAL HIGH (ref 0–37)
Albumin: 2.4 g/dL — ABNORMAL LOW (ref 3.5–5.2)
BILIRUBIN TOTAL: 1 mg/dL (ref 0.3–1.2)
BUN: 6 mg/dL (ref 6–23)
CO2: 27 mmol/L (ref 19–32)
CREATININE: 0.75 mg/dL (ref 0.50–1.35)
Calcium: 8.5 mg/dL (ref 8.4–10.5)
Chloride: 108 mmol/L (ref 96–112)
GFR calc non Af Amer: >90 mL/min (ref >90)
Glucose, Bld: 103 mg/dL — ABNORMAL HIGH (ref 70–99)
Potassium: 3.9 mmol/L (ref 3.5–5.1)
Sodium: 139 mmol/L (ref 135–145)
Total Protein: 6.3 g/dL (ref 6.0–8.3)

## 2014-08-14 LAB — GLUCOSE, CAPILLARY
GLUCOSE-CAPILLARY: 94 mg/dL (ref 70–99)
Glucose-Capillary: 222 mg/dL — ABNORMAL HIGH (ref 70–99)
Glucose-Capillary: 124 mg/dL — ABNORMAL HIGH (ref 70–99)

## 2014-08-14 LAB — AMMONIA: Ammonia: 41 umol/L — ABNORMAL HIGH (ref 11–32)

## 2014-08-14 MED ORDER — VANCOMYCIN HCL IN DEXTROSE 1-5 GM/200ML-% IV SOLN
1000.0000 mg | Freq: Once | INTRAVENOUS | Status: AC
  Administered 2014-08-14: 1000 mg via INTRAVENOUS
  Filled 2014-08-14: qty 200

## 2014-08-14 MED ORDER — VANCOMYCIN HCL 10 G IV SOLR: 1500.0000 mg | Freq: Two times a day (BID) | INTRAVENOUS | Status: DC

## 2014-08-14 MED ORDER — LACTULOSE 10 GM/15ML PO SOLN: 20.0000 g | Freq: Three times a day (TID) | ORAL | Status: AC

## 2014-08-14 MED ORDER — K PHOS MONO-SOD PHOS DI & MONO 155-852-130 MG PO TABS: 250.0000 mg | ORAL_TABLET | Freq: Three times a day (TID) | ORAL | Status: AC

## 2014-08-14 MED ORDER — VANCOMYCIN HCL 10 G IV SOLR
1500.0000 mg | Freq: Two times a day (BID) | INTRAVENOUS | Status: DC
  Filled 2014-08-14 (×2): qty 1500

## 2014-08-14 MED ORDER — HEPARIN SOD (PORK) LOCK FLUSH 100 UNIT/ML IV SOLN
250.0000 [IU] | INTRAVENOUS | Status: AC | PRN
  Administered 2014-08-14: 250 [IU]

## 2014-08-14 MED ORDER — ACETAMINOPHEN 325 MG PO TABS: 325.0000 mg | ORAL_TABLET | Freq: Four times a day (QID) | ORAL | Status: AC | PRN

## 2014-08-14 NOTE — Discharge Instructions (Addendum)
Bacteremia °Bacteremia occurs when bacteria get in your blood. Normal blood does not usually have bacteria. Bacteremia is one way infections can spread from one part of the body to another. °CAUSES  °· Causes may include anything that allows bacteria to get into the body. Examples are: °¨ Catheters. °¨ Intravenous (IV) access tubes. °¨ Cuts or scrapes of the skin. °· Temporary bacteremia may occur during dental procedures, while brushing your teeth, or during a bowel movement. This rarely causes any symptoms or medical problems. °· Bacteria may also get in the bloodstream as a complication of a bacterial infection elsewhere. This includes infected wounds and bacterial infections of the: °¨ Lungs (pneumonia). °¨ Kidneys (pyelonephritis). °¨ Intestines (enteritis, colitis). °¨ Organs in the abdomen (appendicitis, cholecystitis, diverticulitis). °SYMPTOMS  °The body is usually able to clear small numbers of bacteria out of the blood quickly. Brief bacteremia usually does not cause problems.  °· Problems can occur if the bacteria start to grow in number or spread to other parts of the body. If the bacteria start growing, you may develop: °¨ Chills. °¨ Fever. °¨ Nausea. °¨ Vomiting. °¨ Sweating. °¨ Lightheadedness and low blood pressure. °¨ Pain. °· If bacteria start to grow in the linings around the brain, it is called meningitis. This can cause severe headaches, many other problems, and even death. °· If bacteria start to grow in a joint, it causes arthritis with painful joints. If bacteria start to grow in a bone, it is called osteomyelitis. °· Bacteria from the blood can also cause sores (abscesses) in many organs, such as the muscle, liver, spleen, lungs, brain, and kidneys. °DIAGNOSIS  °· This condition is diagnosed by cultures of the blood. °· Cultures may also be taken from other parts of the body that are thought to be causing the bacteremia. A small piece of tissue, fluid, or other product of the body is  sampled. The sample is then put on a growth plate to see if any bacteria grows. °· Other lab tests may be done and the results may be abnormal. °TREATMENT  °Treatment requires a stay in the hospital. You will be given antibiotic medicine through an IV access tube. °PREVENTION  °People with an increased risk of developing bacteremia or complications may be given antibiotics before certain procedures. Examples are: °· A person with a heart murmur or artificial heart valve, before having his or her teeth cleaned. °· Before having a surgical or other invasive procedure. °· Before having a bowel procedure. °Document Released: 02/14/2006 Document Revised: 07/26/2011 Document Reviewed: 11/26/2010 °[employer]® Patient Information ©2015 [employer]. This information is not intended to replace advice given to you by your health care provider. Make sure you discuss any questions you have with your health care provider. ° °

## 2014-08-14 NOTE — Discharge Summary (Signed)
Physician Discharge Summary  John Santiago MRN: [id number] DOB/AGE: [birth year] 54 y.o.  PCP: No primary care provider on file.   Admit date: 08/05/2014 Discharge date: 08/14/2014  Discharge Diagnoses:     Principal Problem:   Bacteremia due to methicillin resistant Staphylococcus aureus Active Problems:   Sepsis   Altered mental status   UTI (lower urinary tract infection)   Hematuria   Cigarette smoker   Thrombocytopenia   Normocytic anemia   Depression   Diabetes mellitus without complication   Hyperlipidemia   Hypertension   Urinary tract infectious disease   Nephrolithiasis   Hypomagnesemia   Hypokalemia   Hyponatremia   Increased ammonia level   Hypophosphatemia   MRSA bacteremia   Follow recommendations  CBC/BMET weekly Follow up PCP in 5-7 days  follow-up with infectious disease Dr. Megan Salon in one to 2 weeks     Medication List    STOP taking these medications        simvastatin 80 MG tablet  Commonly known as:  ZOCOR      TAKE these medications        acetaminophen 325 MG tablet  Commonly known as:  TYLENOL  Take 1 tablet (325 mg total) by mouth every 6 (six) hours as needed for mild pain (or Fever >/= 101).     aspirin EC 325 MG tablet  Take 325 mg by mouth daily.     buPROPion 150 MG 12 hr tablet  Commonly known as:  WELLBUTRIN SR  Take 150 mg by mouth 2 (two) times daily.     lactulose 10 GM/15ML solution  Commonly known as:  CHRONULAC  Take 30 mLs (20 g total) by mouth 3 (three) times daily.     metFORMIN 1000 MG tablet  Commonly known as:  GLUCOPHAGE  Take 1,000 mg by mouth 2 (two) times daily with a meal.     metoprolol tartrate 25 MG tablet  Commonly known as:  LOPRESSOR  Take 25 mg by mouth 2 (two) times daily.     phosphorus 155-852-130 MG tablet  Commonly known as:  K PHOS NEUTRAL  Take 1 tablet (250 mg total) by mouth 3 (three) times daily.     sildenafil 100 MG tablet  Commonly known as:  VIAGRA  Take 50 mg by  mouth daily as needed for erectile dysfunction.     terazosin 2 MG capsule  Commonly known as:  HYTRIN  Take 4 mg by mouth at bedtime.     vancomycin 1,500 mg in sodium chloride 0.9 % 500 mL  Inject 1,500 mg into the vein every 12 (twelve) hours.        Discharge Condition:   Disposition: Final discharge disposition not confirmed   Consults:  Infectious disease Cardiology for TEE    Significant Diagnostic Studies: Ct Head Wo Contrast  08/05/2014   CLINICAL DATA:  Slurred speech and facial droop today. Some confusion.  EXAM: CT HEAD WITHOUT CONTRAST  TECHNIQUE: Contiguous axial images were obtained from the base of the skull through the vertex without intravenous contrast.  COMPARISON:  None.  FINDINGS: Age advanced cerebral atrophy and ventriculomegaly. No extra-axial fluid collections are identified. No CT findings for acute hemispheric infarction or intracranial hemorrhage. No mass lesions. The brainstem and cerebellum are grossly normal. Vascular calcifications are noted.  The bony structures are intact. The paranasal sinuses and mastoid air cells are clear. Patient has had prior left mastoid surgery with mastoid bowl procedure.  IMPRESSION: No CT findings  for an acute intracranial process.   Electronically Signed   By: Marijo Sanes M.D.   On: 08/05/2014 20:48   Mr Brain Wo Contrast  08/05/2014   CLINICAL DATA:  54 year old male code stroke with slurred speech and facial droop. Initial encounter.  EXAM: MRI HEAD WITHOUT CONTRAST  TECHNIQUE: Multiplanar, multiecho pulse sequences of the brain and surrounding structures were obtained without intravenous contrast.  COMPARISON:  Head CT without contrast 2037 hours the same day.  FINDINGS: Noncontrast MRI imaging of the brain was limited to diffusion-weighted (axial and coronal), and axial FLAIR imaging.  No restricted diffusion or evidence of acute infarction. Patchy cerebral white matter FLAIR hyperintensity, mild to moderate for age.  No cortical encephalomalacia identified. No midline shift, mass effect, or evidence of intracranial mass lesion. No ventriculomegaly.  IMPRESSION: No evidence of acute ischemia. Study discussed by telephone with Dr. Roland Rack on 08/05/2014 at 21:44 .   Electronically Signed   By: Genevie Ann M.D.   On: 08/05/2014 21:45   US Abdomen Complete  08/06/2014   CLINICAL DATA:  Hematuria, elevated LFTs  EXAM: ULTRASOUND ABDOMEN COMPLETE  COMPARISON:  None.  FINDINGS: Gallbladder: Contracted in nature. Wall thickening is noted although this may simply be related to the contracted nature of the gallbladder. A minimal amount of pericholecystic fluid is noted. No gallstones are seen.  Common bile duct: Diameter: 2.8 mm.  Liver: No focal mass lesion is noted. Normal echogenicity is seen. There is prominence in the region of the caudate lobe which may be related to some degree of hypertrophy. No definitive mass is seen.  IVC: No abnormality visualized.  Pancreas: Visualized portion unremarkable.  Spleen: Splenomegaly is noted.  Right Kidney: Length: 12.5 cm. Echogenicity within normal limits. No mass or hydronephrosis visualized.  Left Kidney: Length: 12.1 cm. An 8 mm stone is noted centrally within the collecting system. No mass or hydronephrosis visualized.  Abdominal aorta: No aneurysm visualized.  Other findings: None.  IMPRESSION: Left renal stone without obstructive change.  Splenomegaly.  Changes suggestive of caudate lobe hypertrophy.  Decompressed gallbladder with wall thickening of uncertain significance. A minimal amount of pericholecystic fluid is seen.   Electronically Signed   By: Inez Catalina M.D.   On: 08/06/2014 12:33   Dg Chest Port 1 View  08/06/2014   CLINICAL DATA:  Acute onset of shortness of breath. Altered mental status. Initial encounter.  EXAM: PORTABLE CHEST - 1 VIEW  COMPARISON:  None.  FINDINGS: The lungs are well-aerated. Minimal bibasilar atelectasis is noted. There is no evidence of  pleural effusion or pneumothorax.  The cardiomediastinal silhouette is within normal limits. No acute osseous abnormalities are seen. Chronic right fifth and sixth posterior rib deformities are noted.  IMPRESSION: Minimal bibasilar atelectasis noted.   Electronically Signed   By: Garald Balding M.D.   On: 08/06/2014 00:30   TEE Negative for endocarditis   Microbiology: Recent Results (from the past 240 hour(s))  Urine culture     Status: None   Collection Time: 08/05/14 10:11 PM  Result Value Ref Range Status   Specimen Description URINE, CLEAN CATCH  Final   Special Requests ADDED 0004 08/06/14  Final   Colony Count   Final    >=100,000 COLONIES/ML Performed at [facility]    Culture   Final    METHICILLIN RESISTANT STAPHYLOCOCCUS AUREUS Note: RIFAMPIN AND GENTAMICIN SHOULD NOT BE USED AS SINGLE DRUGS FOR TREATMENT OF STAPH INFECTIONS. CRITICAL RESULT CALLED TO, READ BACK  BY AND VERIFIED WITH: LEE WORLEY AT 3:04 A.M. ON 08/08/2014 WARRB Performed at [facility]    Report Status 08/08/2014 FINAL  Final   Organism ID, Bacteria METHICILLIN RESISTANT STAPHYLOCOCCUS AUREUS  Final      Susceptibility   Methicillin resistant staphylococcus aureus - MIC*    GENTAMICIN <=0.5 SENSITIVE Sensitive     LEVOFLOXACIN 4 INTERMEDIATE Intermediate     NITROFURANTOIN <=16 SENSITIVE Sensitive     OXACILLIN >=4 RESISTANT Resistant     PENICILLIN >=0.5 RESISTANT Resistant     RIFAMPIN <=0.5 SENSITIVE Sensitive     TRIMETH/SULFA <=10 SENSITIVE Sensitive     VANCOMYCIN 1 SENSITIVE Sensitive     TETRACYCLINE <=1 SENSITIVE Sensitive     * METHICILLIN RESISTANT STAPHYLOCOCCUS AUREUS  Blood Culture (routine x 2)     Status: None   Collection Time: 08/05/14 11:52 PM  Result Value Ref Range Status   Specimen Description BLOOD LEFT ANTECUBITAL  Final   Special Requests BOTTLES DRAWN AEROBIC AND ANAEROBIC 5CC EA  Final   Culture   Final    STAPHYLOCOCCUS AUREUS Note: RIFAMPIN AND  GENTAMICIN SHOULD NOT BE USED AS SINGLE DRUGS FOR TREATMENT OF STAPH INFECTIONS. SUSCEPTIBILITIES PERFORMED ON PREVIOUS CULTURE WITHIN THE LAST 5 DAYS. Note: Gram Stain Report Called to,Read Back By and Verified With: HEATHER RICHARD 08/06/14 AT [address] Performed at [facility]    Report Status 08/09/2014 FINAL  Final  Blood Culture (routine x 2)     Status: None   Collection Time: 08/05/14 11:55 PM  Result Value Ref Range Status   Specimen Description BLOOD LEFT WRIST  Final   Special Requests BOTTLES DRAWN AEROBIC AND ANAEROBIC 5CC EA  Final   Culture   Final    METHICILLIN RESISTANT STAPHYLOCOCCUS AUREUS Note: RIFAMPIN AND GENTAMICIN SHOULD NOT BE USED AS SINGLE DRUGS FOR TREATMENT OF STAPH INFECTIONS. This organism DOES NOT demonstrate inducible Clindamycin resistance in vitro. CRITICAL RESULT CALLED TO, READ BACK BY AND VERIFIED WITH: CAROL SCHILLER @  3:28 PM 08/08/14 BY DWEEKS Note: Gram Stain Report Called to,Read Back By and Verified With: HEATHER RICHARD 07/26/14 AT 2355 RIDK Performed at [facility]    Report Status 08/09/2014 FINAL  Final   Organism ID, Bacteria METHICILLIN RESISTANT STAPHYLOCOCCUS AUREUS  Final      Susceptibility   Methicillin resistant staphylococcus aureus - MIC*    CLINDAMYCIN <=0.25 SENSITIVE Sensitive     ERYTHROMYCIN >=8 RESISTANT Resistant     GENTAMICIN <=0.5 SENSITIVE Sensitive     LEVOFLOXACIN 4 INTERMEDIATE Intermediate     OXACILLIN >=4 RESISTANT Resistant     PENICILLIN >=0.5 RESISTANT Resistant     RIFAMPIN <=0.5 SENSITIVE Sensitive     TRIMETH/SULFA <=10 SENSITIVE Sensitive     VANCOMYCIN 1 SENSITIVE Sensitive     TETRACYCLINE <=1 SENSITIVE Sensitive     * METHICILLIN RESISTANT STAPHYLOCOCCUS AUREUS  MRSA PCR Screening     Status: Abnormal   Collection Time: 08/06/14  4:00 AM  Result Value Ref Range Status   MRSA by PCR POSITIVE (A) NEGATIVE Final    Comment:        The GeneXpert MRSA Assay (FDA approved for NASAL  specimens only), is one component of a comprehensive MRSA colonization surveillance program. It is not intended to diagnose MRSA infection nor to guide or monitor treatment for MRSA infections. RESULT CALLED TO, READ BACK BY AND VERIFIED WITH: J.LINDSAY,RN 08/06/14 0831 BY BSLADE   Culture, blood (routine x 2)  Status: None (Preliminary result)   Collection Time: 08/08/14  3:00 PM  Result Value Ref Range Status   Specimen Description BLOOD LEFT HAND  Final   Special Requests BOTTLES DRAWN AEROBIC ONLY 3CC  Final   Culture   Final           BLOOD CULTURE RECEIVED NO GROWTH TO DATE CULTURE WILL BE HELD FOR 5 DAYS BEFORE ISSUING A FINAL NEGATIVE REPORT Performed at [facility]    Report Status PENDING  Incomplete  Culture, blood (routine x 2)     Status: None (Preliminary result)   Collection Time: 08/08/14  3:15 PM  Result Value Ref Range Status   Specimen Description BLOOD LEFT HAND  Final   Special Requests BOTTLES DRAWN AEROBIC ONLY 3CC  Final   Culture   Final           BLOOD CULTURE RECEIVED NO GROWTH TO DATE CULTURE WILL BE HELD FOR 5 DAYS BEFORE ISSUING A FINAL NEGATIVE REPORT Performed at [facility]    Report Status PENDING  Incomplete     Labs: Results for orders placed or performed during the hospital encounter of 08/05/14 (from the past 48 hour(s))  Glucose, capillary     Status: Abnormal   Collection Time: 08/12/14 12:15 PM  Result Value Ref Range   Glucose-Capillary 146 (H) 70 - 99 mg/dL  Glucose, capillary     Status: Abnormal   Collection Time: 08/12/14  5:14 PM  Result Value Ref Range   Glucose-Capillary 104 (H) 70 - 99 mg/dL  Glucose, capillary     Status: Abnormal   Collection Time: 08/12/14  9:44 PM  Result Value Ref Range   Glucose-Capillary 216 (H) 70 - 99 mg/dL  CBC with Differential/Platelet     Status: Abnormal   Collection Time: 08/13/14  5:24 AM  Result Value Ref Range   WBC 4.5 4.0 - 10.5 K/uL   RBC 3.32 (L) 4.22 - 5.81  MIL/uL   Hemoglobin 10.2 (L) 13.0 - 17.0 g/dL   HCT 31.4 (L) 39.0 - 52.0 %   MCV 94.6 78.0 - 100.0 fL   MCH 30.7 26.0 - 34.0 pg   MCHC 32.5 30.0 - 36.0 g/dL   RDW 15.5 11.5 - 15.5 %   Platelets 65 (L) 150 - 400 K/uL    Comment: CONSISTENT WITH PREVIOUS RESULT   Neutrophils Relative % 64 43 - 77 %   Neutro Abs 2.9 1.7 - 7.7 K/uL   Lymphocytes Relative 23 12 - 46 %   Lymphs Abs 1.0 0.7 - 4.0 K/uL   Monocytes Relative 10 3 - 12 %   Monocytes Absolute 0.5 0.1 - 1.0 K/uL   Eosinophils Relative 3 0 - 5 %   Eosinophils Absolute 0.1 0.0 - 0.7 K/uL   Basophils Relative 0 0 - 1 %   Basophils Absolute 0.0 0.0 - 0.1 K/uL  Magnesium     Status: None   Collection Time: 08/13/14  5:24 AM  Result Value Ref Range   Magnesium 1.8 1.5 - 2.5 mg/dL  Glucose, capillary     Status: Abnormal   Collection Time: 08/13/14  7:56 AM  Result Value Ref Range   Glucose-Capillary 103 (H) 70 - 99 mg/dL  Glucose, capillary     Status: None   Collection Time: 08/13/14 11:50 AM  Result Value Ref Range   Glucose-Capillary 93 70 - 99 mg/dL  Glucose, capillary     Status: Abnormal   Collection Time: 08/13/14  6:25 PM  Result Value Ref Range   Glucose-Capillary 106 (H) 70 - 99 mg/dL  Glucose, capillary     Status: Abnormal   Collection Time: 08/13/14  9:00 PM  Result Value Ref Range   Glucose-Capillary 184 (H) 70 - 99 mg/dL  Ammonia     Status: Abnormal   Collection Time: 08/14/14  6:05 AM  Result Value Ref Range   Ammonia 41 (H) 11 - 32 umol/L  CBC with Differential/Platelet     Status: Abnormal   Collection Time: 08/14/14  6:15 AM  Result Value Ref Range   WBC 5.0 4.0 - 10.5 K/uL   RBC 3.26 (L) 4.22 - 5.81 MIL/uL   Hemoglobin 10.0 (L) 13.0 - 17.0 g/dL   HCT 30.6 (L) 39.0 - 52.0 %   MCV 93.9 78.0 - 100.0 fL   MCH 30.7 26.0 - 34.0 pg   MCHC 32.7 30.0 - 36.0 g/dL   RDW 15.5 11.5 - 15.5 %   Platelets 65 (L) 150 - 400 K/uL    Comment: CONSISTENT WITH PREVIOUS RESULT   Neutrophils Relative % 69 43 - 77 %    Neutro Abs 3.5 1.7 - 7.7 K/uL   Lymphocytes Relative 19 12 - 46 %   Lymphs Abs 0.9 0.7 - 4.0 K/uL   Monocytes Relative 9 3 - 12 %   Monocytes Absolute 0.5 0.1 - 1.0 K/uL   Eosinophils Relative 3 0 - 5 %   Eosinophils Absolute 0.1 0.0 - 0.7 K/uL   Basophils Relative 0 0 - 1 %   Basophils Absolute 0.0 0.0 - 0.1 K/uL  Magnesium     Status: None   Collection Time: 08/14/14  6:15 AM  Result Value Ref Range   Magnesium 1.8 1.5 - 2.5 mg/dL  Comprehensive metabolic panel     Status: Abnormal   Collection Time: 08/14/14  6:15 AM  Result Value Ref Range   Sodium 139 135 - 145 mmol/L   Potassium 3.9 3.5 - 5.1 mmol/L   Chloride 108 96 - 112 mmol/L   CO2 27 19 - 32 mmol/L   Glucose, Bld 103 (H) 70 - 99 mg/dL   BUN 6 6 - 23 mg/dL   Creatinine, Ser 0.75 0.50 - 1.35 mg/dL   Calcium 8.5 8.4 - 10.5 mg/dL   Total Protein 6.3 6.0 - 8.3 g/dL   Albumin 2.4 (L) 3.5 - 5.2 g/dL   AST 73 (H) 0 - 37 U/L   ALT 36 0 - 53 U/L   Alkaline Phosphatase 104 39 - 117 U/L   Total Bilirubin 1.0 0.3 - 1.2 mg/dL   GFR calc non Af Amer >90 >90 mL/min   GFR calc Af Amer >90 >90 mL/min    Comment: (NOTE) The eGFR has been calculated using the CKD EPI equation. This calculation has not been validated in all clinical situations. eGFR's persistently <90 mL/min signify possible Chronic Kidney Disease.    Anion gap 4 (L) 5 - 15  Glucose, capillary     Status: None   Collection Time: 08/14/14  7:59 AM  Result Value Ref Range   Glucose-Capillary 94 70 - 99 mg/dL     HPI : John Santiago is a 54 y.o. male whose had intermittent painless hematuria over the past several months. He was seen at the [facility] about 2 months ago and treated for possible UTI with 10 days of some oral antibiotic. His hematuria seemed to get a little bit better after that time.  Otherwise he felt like he was in his usual state of health until 08/05/2014. He was sitting in his car with his wife waiting for his son to get off of work. He  suddenly developed severe nausea and vomiting with some confusion and was brought to the hospital. He was febrile. His urinalysis revealed hematuria and pyuria. Urine and blood cultures are growing MRSA. He does not recall much else from around the time of admission and no family members are present. He is feeling much better now.   HOSPITAL COURSE: * Sepsis secondary to MRSA bacteremia with pyelonephritis / MRSA urinary tract infection - Sepsis criteria met on the admission. Patient is on vancomycin for MRSA bacteremia and MRSA UTI. - Blood cultures on the admission grew MRSA. Repeat blood cultures show no growth to date. - Continue vancomycin for total of 3 weeks per ID  PICC line placed ,anticipated stop date is 4/11 - Cardiology consulted for TEE. Negative for endocarditis Follow-up with Dr. Megan Salon in one to 2 weeks vancomycin 1500 mg IV q12h for 13 more days vancomycin trough at new steady state and CBC/BMET weekly   Recurrent UTI - L non obstructing renal stone  - Continue vancomycin  - As noted, urine culture growing MRSA.  Hematuria - Likely secondary to sepsis and urinary tract infection. - No further episodes of hematuria noted. - Hemoglobin is 10.2.  Increased ammonia level /  - Normal liver echogenicity on [place] - concerning for possible EtOH abuse  - Ammonia level on 08/10/2014 was 62.>59, will repeat. - Continue lactulose 20 g by mouth 3 times a day.  Thrombocytopenia  - Likely secondary to bone marrow suppression from alcohol abuse - Platelet count 65 this morning. - Continue to monitor CBC daily.  Anemia of chronic disease, alcohol induced bone marrow damage - Secondary to bone marrow suppression from alcohol abuse. - Hemoglobin 10.8. - Continue to monitor CBC.  Essential hypertension - Continue metoprolol 25 mg twice a day  Hypomagnesemia - WNL  Hypokalemia  - Supplemented and WNL   Hyponatremia  - Sodium normalized with IV  fluids.  Hypophosphatemia - K-Phos 250 mg BID for 2 doses - Phosphorus WNL  DVT prophylaxis  - SCD's bilaterally  Code Status: Full.     Discharge Exam: Blood pressure 121/57, pulse 83, temperature 98.5 F (36.9 C), temperature source Oral, resp. rate 18, height 5' 7"  (1.702 m), weight 85.1 kg (187 lb 9.8 oz), SpO2 98 %.  General: he is alert and comfortable Skin: new right arm PICC Lungs: clear Cor: regular S1 and S2 with no murmurs Abdomen: soft and nontender. No CVA tenderness        Discharge Instructions    Diet - low sodium heart healthy    Complete by:  As directed      Increase activity slowly    Complete by:  As directed            Follow-up Information    Follow up with PCP. Schedule an appointment as soon as possible for a visit in 3 days.      Follow up with Michel Bickers, MD. Schedule an appointment as soon as possible for a visit in 1 week.   Specialty:  Infectious Diseases   Contact information:   [address] [place] [address] [phone]       Signed: Reyne Dumas 08/14/2014, 12:04 PM

## 2014-08-14 NOTE — Progress Notes (Signed)
08/14/14 Contacted Keoma at Dr. Milana Obeyyers, instructed to call Victorino DikeJennifer another social worker from Dr. Milana Obeyyers office. Left voicemail with Victorino DikeJennifer. Contacted Leo [facility] [place], he requested H and P, IV antibiotics order,HHRN order, facesheet, PICC line note, and MD progress note.Gave him phone # of Terrial RhodesKeoma and Victorino DikeJennifer. Faxed all requested info to [phone].Confirmed receipt of info. Contacted Dr.Bajillan, he stated that he has approved IV vancomycin thru 09/03/14 and has sent approval to social worker at Dr. Milana Obeyyers.Contacted Victorino DikeJennifer again and left second voicemail. Received call from Luberta RobertsonLakeisha Bares [phone] ext [phone], she is following case iinstead of Victorino DikeJennifer. She has been in touch with [facility], she wanted to verify medication times,vanc dose changed by pharmacy to 1500 q12. She stated that she would need a copy of new rx and that it would need approval.Faxed new rx to [phone] and faxed new rx to [facility]. Spoke with Kem ParkinsonLeo Bordatto from [facility] who came to see the patient, they are just awaiting [facility] approval. Kandice Hamsontacted Lakeisha again and asked that approval be obtained ASAP so patient able to d/c today.Contacted Mick SellLakeisha, she has received approval for dose change of vancomycin, she has contacted [facility] with the approval. Liam Rogersontacted Leo at [facility], they have received the approval and will be able to start service tonight.He will contact the patient to update him.I updated the patient and the patient's RN.

## 2014-08-14 NOTE — Progress Notes (Signed)
  Outpatient Parenteral Antimicrobial Therapy (OPAT) Pharmacy Monitoring  Guidelines developed by the Infectious Diseases Society of America (IDSA) recommend that hospitals who send patients home on intravenous (IV) antibiotics have an active performance improvement program.  IDSA guidelines also recommend that an infection specialist, such as a pharmacist, be involved in the evaluation of candidates for discharge with OPAT.      Yes No Comments  Assessed patient for appropriateness of OPAT? [x]  []  Is IV antimicrobial therapy needed? Is the home environment safe and adequate to support care? Are the patient/caregivers willing to participate and able to safely, effectively, and reliably deliver parenteral antimicrobial therapy? Is communication about problems and monitoring of therapy in place with patient? Does the patient/caregiver understand the benefits/risks with OPAT?  Are oral antibiotics an option? []  [x]  Patients diagnosed with endocarditis are not candidates for oral antibiotics.  Is antibiotic selected the best option for treatment indication? [x]  []  Can therapy be de-escalated further?  Is the dose appropriate? []  [x]  Consider renal function and administration schedule for outpatient setting.  Is the length of therapy appropriate? [x]  []  Consider disease-specific guidelines when available.  Recommend changing antibiotic regimen? [x]  []  If yes, recommend vancomycin 1500 mg IV q12h.    Final antibiotic regimen for discharge is vancomycin 1500 mg IV q12h for 13 more days.  Follow-up with Dr. Orvan Falconerampbell in the [facility].  Laboratory monitoring and follow-up for discharge with OPAT: vancomycin trough at new steady state and CBC/BMET weekly.   Thank you for allowing pharmacy to be a part of this patient's care team.  Jasmeet Gehl L. Roseanne RenoStewart, PharmD Clinical Pharmacy Resident Pager: [phone] 08/14/2014 10:41 AM

## 2014-08-14 NOTE — Progress Notes (Signed)
Reviewed discharge instructions with pt and PICC line capped.  Pt denied any other needs at this time.  Will take to discharge location via wheelchair once ride arrives.

## 2014-08-15 LAB — CULTURE, BLOOD (ROUTINE X 2)
Culture: NO GROWTH
Culture: NO GROWTH

## 2014-08-21 ENCOUNTER — Ambulatory Visit (INDEPENDENT_AMBULATORY_CARE_PROVIDER_SITE_OTHER): Payer: Non-veteran care | Admitting: Internal Medicine

## 2014-08-21 ENCOUNTER — Encounter: Payer: Self-pay | Admitting: Internal Medicine

## 2014-08-21 VITALS — BP 144/80 | HR 85 | Temp 97.7°F | Ht 67.0 in | Wt 189.5 lb

## 2014-08-21 DIAGNOSIS — R7881 Bacteremia: Secondary | ICD-10-CM | POA: Diagnosis not present

## 2014-08-21 DIAGNOSIS — B9562 Methicillin resistant Staphylococcus aureus infection as the cause of diseases classified elsewhere: Secondary | ICD-10-CM

## 2014-08-21 MED ORDER — VANCOMYCIN HCL 10 G IV SOLR: 1500.0000 mg | Freq: Two times a day (BID) | INTRAVENOUS | Status: AC

## 2014-08-21 NOTE — Progress Notes (Signed)
Patient ID: John Santiago, male   DOB: [birth year], 54 y.o.   MRN: [id number]         [facility]  Patient Active Problem List   Diagnosis Date Noted  . Bacteremia due to methicillin resistant Staphylococcus aureus     Priority: High  . Thrombocytopenia 08/08/2014    Priority: Medium  . Sepsis 08/06/2014    Priority: Medium  . Altered mental status     Priority: Medium  . UTI (lower urinary tract infection)     Priority: Medium  . Hematuria     Priority: Medium  . MRSA bacteremia   . Increased ammonia level   . Hypophosphatemia   . Cigarette smoker 08/08/2014  . Normocytic anemia 08/08/2014  . Depression 08/08/2014  . Diabetes mellitus without complication 08/08/2014  . Hyperlipidemia 08/08/2014  . Hypertension 08/08/2014  . Urinary tract infectious disease   . Nephrolithiasis   . Hypomagnesemia   . Hypokalemia   . Hyponatremia     Patient's Medications  New Prescriptions   No medications on file  Previous Medications   ACETAMINOPHEN (TYLENOL) 325 MG TABLET    Take 1 tablet (325 mg total) by mouth every 6 (six) hours as needed for mild pain (or Fever >/= 101).   ASPIRIN EC 325 MG TABLET    Take 325 mg by mouth daily.   BUPROPION (WELLBUTRIN SR) 150 MG 12 HR TABLET    Take 150 mg by mouth 2 (two) times daily.   LACTULOSE (CHRONULAC) 10 GM/15ML SOLUTION    Take 30 mLs (20 g total) by mouth 3 (three) times daily.   METFORMIN (GLUCOPHAGE) 1000 MG TABLET    Take 1,000 mg by mouth 2 (two) times daily with a meal.   METOPROLOL TARTRATE (LOPRESSOR) 25 MG TABLET    Take 25 mg by mouth 2 (two) times daily.   PHOSPHORUS (K PHOS NEUTRAL) 155-852-130 MG TABLET    Take 1 tablet (250 mg total) by mouth 3 (three) times daily.   SILDENAFIL (VIAGRA) 100 MG TABLET    Take 50 mg by mouth daily as needed for erectile dysfunction.   TERAZOSIN (HYTRIN) 2 MG CAPSULE    Take 4 mg by mouth at bedtime.  Modified Medications   Modified Medication Previous Medication   VANCOMYCIN 1,500 MG IN SODIUM CHLORIDE 0.9 % 500 ML vancomycin 1,500 mg in sodium chloride 0.9 % 500 mL      Inject 1,500 mg into the vein every 12 (twelve) hours.    Inject 1,500 mg into the vein every 12 (twelve) hours.  Discontinued Medications   No medications on file    Subjective: John Santiago is in for his hospital follow-up visit. He was hospitalized last month with MRSA bacteremia and pyelonephritis. He responded promptly to IV vancomycin. Repeat blood cultures were negative and he had no evidence of endocarditis by transthoracic or transesophageal echocardiogram. Ultrasound showed normal kidneys with a 5 mm nonobstructing stone in his left kidney. He is now completed 16 days of therapy. He is feeling much better. He's had no problems tolerating his PICC or vancomycin.  Review of Systems: Pertinent items are noted in HPI.  Past Medical History  Diagnosis Date  . Depression   . Diabetes mellitus without complication   . Hyperlipidemia   . Hypertension   . Erectile dysfunction   . Fracture of right lower leg     History  Substance Use Topics  . Smoking status: Current Every Day Smoker -- 1.00  packs/day    Types: Cigarettes  . Smokeless tobacco: Former Neurosurgeon  . Alcohol Use: 8.4 oz/week    14 Standard drinks or equivalent per week    No family history on file.  No Known Allergies  Objective: Temp: 97.7 F (36.5 C) (04/06 0942) Temp Source: Oral (04/06 0942) BP: 144/80 mmHg (04/06 0942) Pulse Rate: 85 (04/06 0942)  General: He is smiling and in good spirits Skin: Right arm PICC site appears normal Lungs: Clear Cor: Regular S1 and S2 with no murmur Abdomen: Obese but soft and nontender. No CVA or suprapubic tenderness   Assessment: He is improving on therapy for MRSA bacteremia and pyelonephritis.  Plan: 1. Continue vancomycin for 3 weeks total through 08/26/2014 2. Follow-up next month   Cliffton Asters, MD [facility] [phone] pager   [phone] cell 08/21/2014, 9:58 AM

## 2014-09-19 ENCOUNTER — Ambulatory Visit: Payer: Non-veteran care | Admitting: Internal Medicine

## 2014-10-15 ENCOUNTER — Ambulatory Visit: Payer: Non-veteran care | Admitting: Internal Medicine

## 2014-10-15 ENCOUNTER — Telehealth: Payer: Self-pay | Admitting: *Deleted

## 2014-10-15 NOTE — Telephone Encounter (Signed)
Voice mail not set up, unable to speak with the pt.

## 2016-02-15 DEATH — deceased

## 2016-03-23 IMAGING — CT CT HEAD W/O CM
1 of 4 series · 14 of 30 positions shown, 18 images · non-contrast
Comparison: None.

CLINICAL DATA: Slurred speech and facial droop today. Some
confusion.

EXAM:
CT HEAD WITHOUT CONTRAST
TECHNIQUE: Contiguous axial images were obtained from the base of the skull
through the vertex without intravenous contrast.

[Series 4: head 2.0 h70h · axial · 0.44mm/px · z∈[-166,-22]mm · 14 of 84 slices shown, 18 images]
[im 6/84  brain]
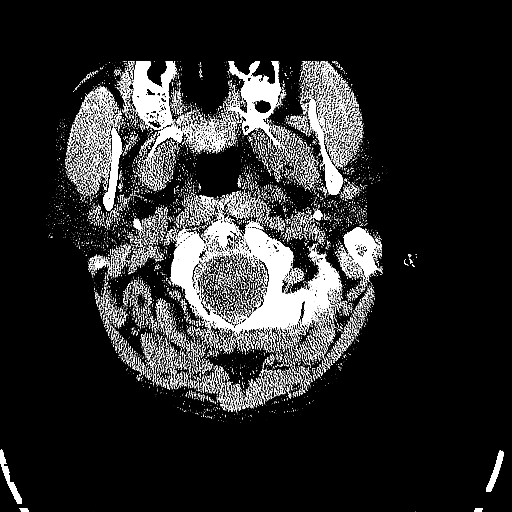
[im 6/84  bone]
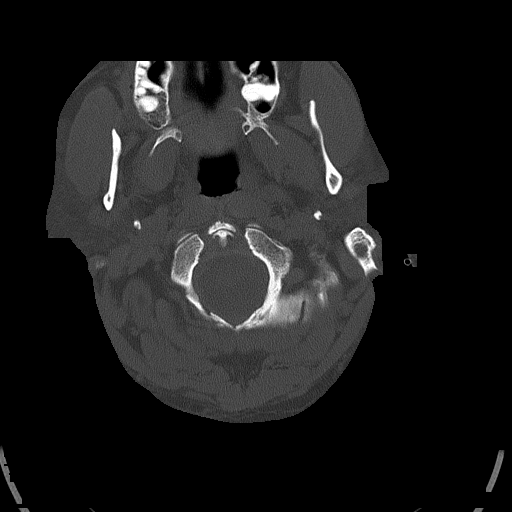
[im 12/84  brain]
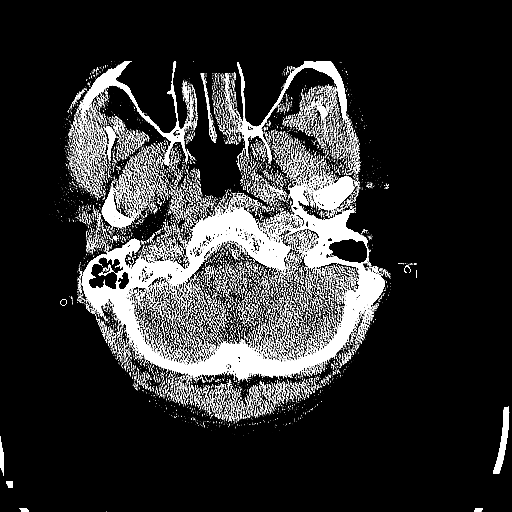
[im 17/84  brain]
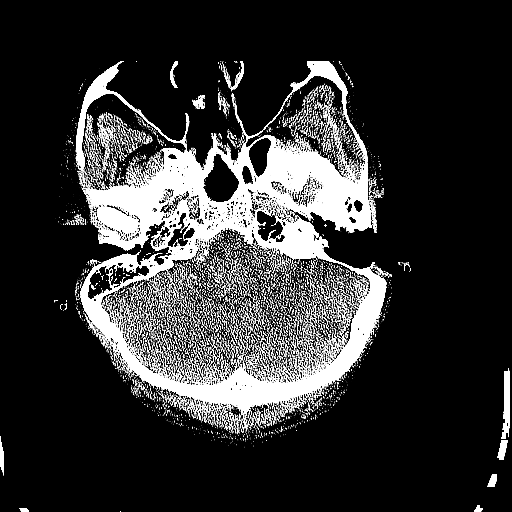
[im 23/84  brain]
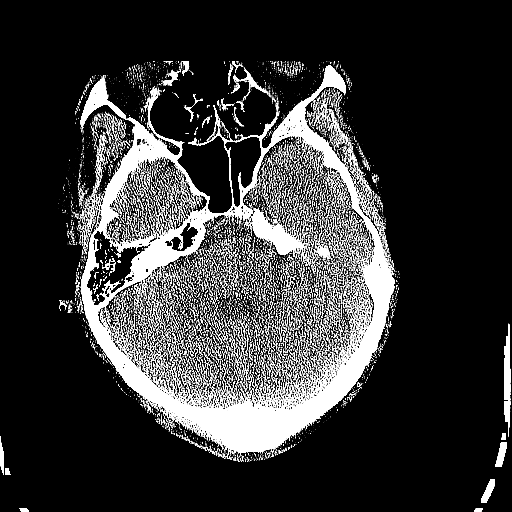
[im 28/84  brain]
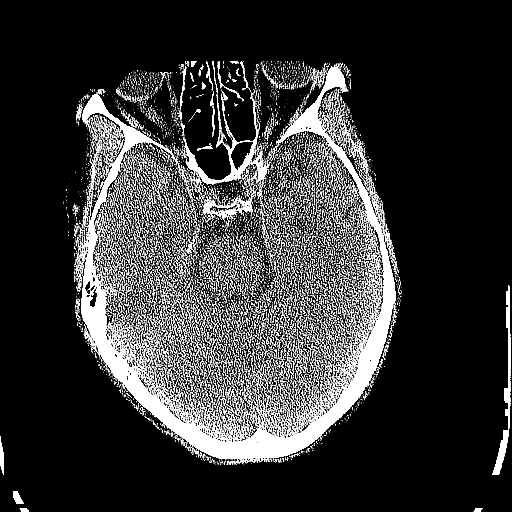
[im 28/84  bone]
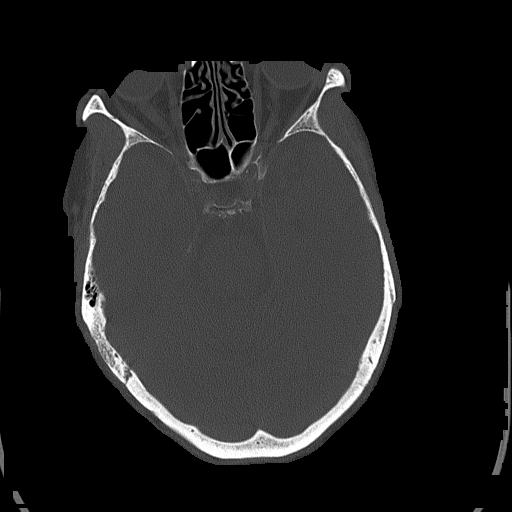
[im 34/84  brain]
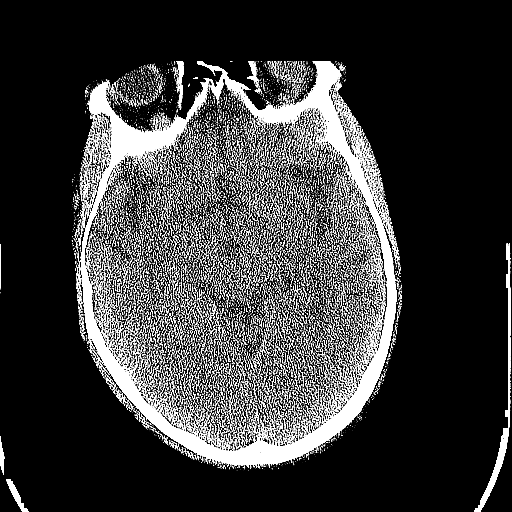
[im 39/84  brain]
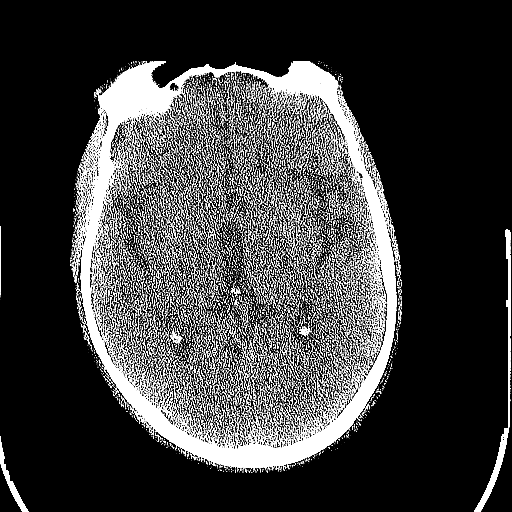
[im 45/84  brain]
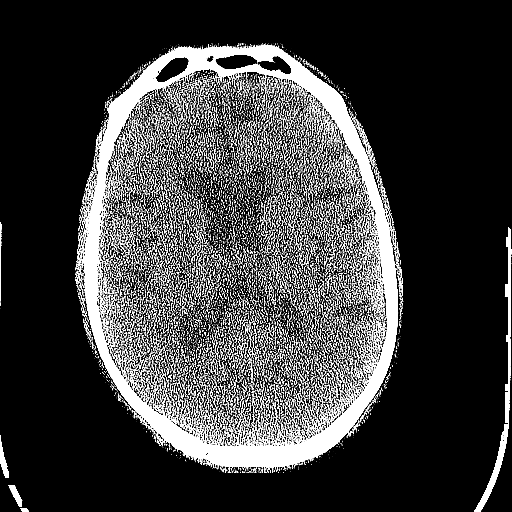
[im 50/84  brain]
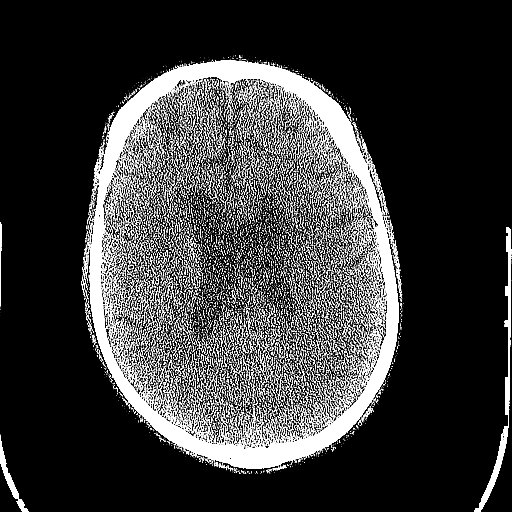
[im 50/84  bone]
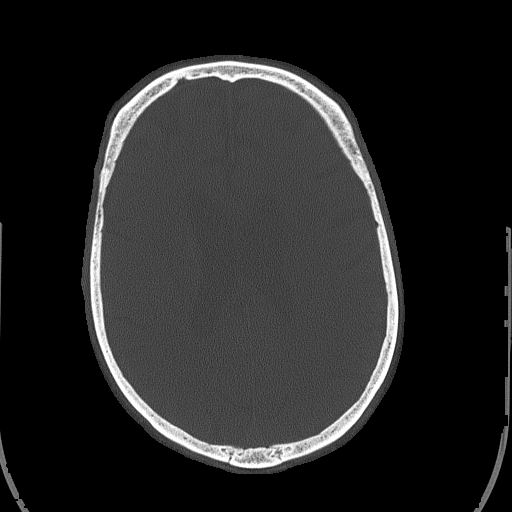
[im 56/84  brain]
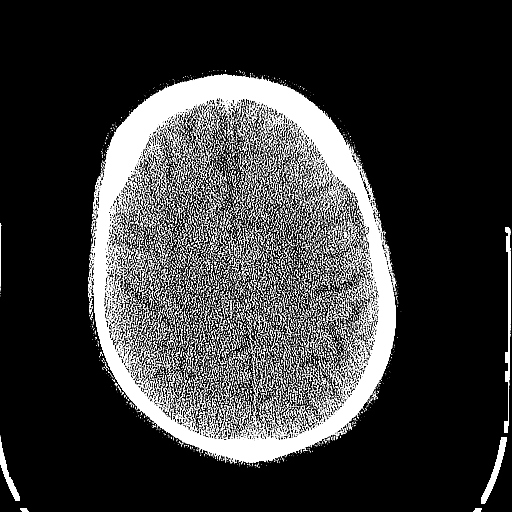
[im 61/84  brain]
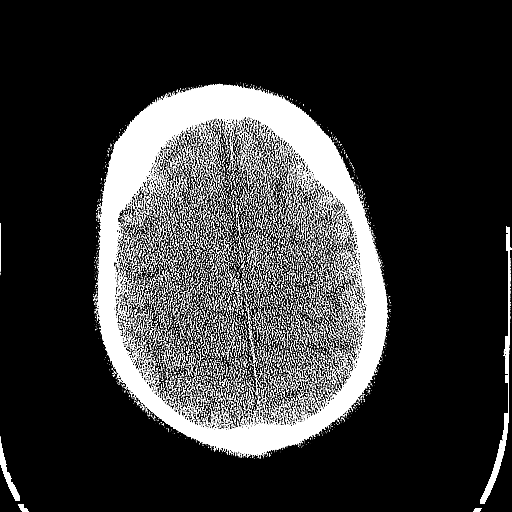
[im 67/84  brain]
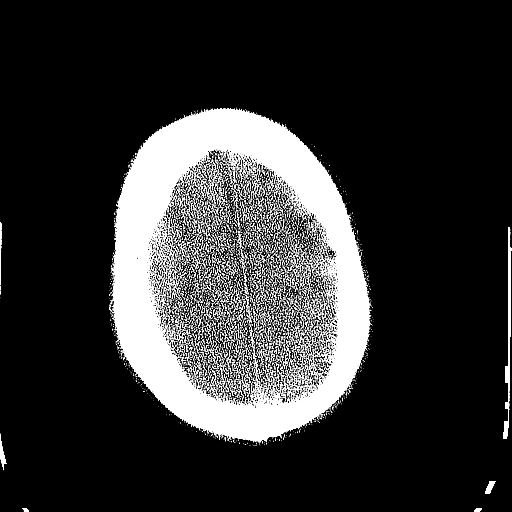
[im 72/84  brain]
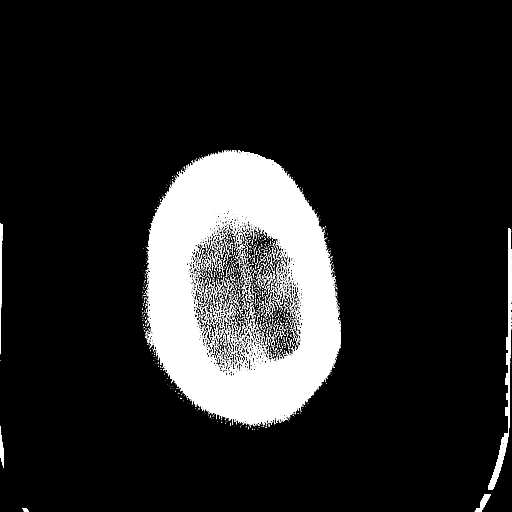
[im 72/84  bone]
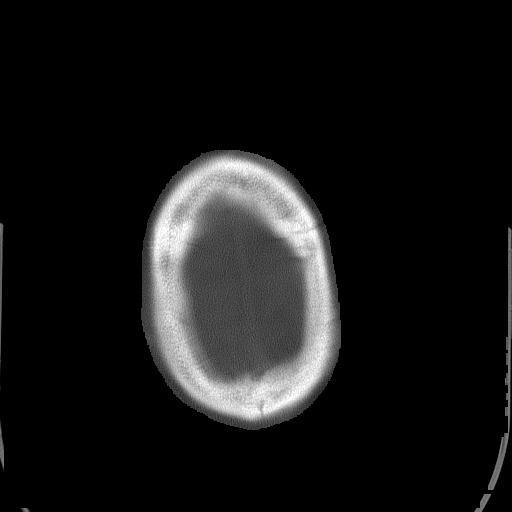
[im 78/84  brain]
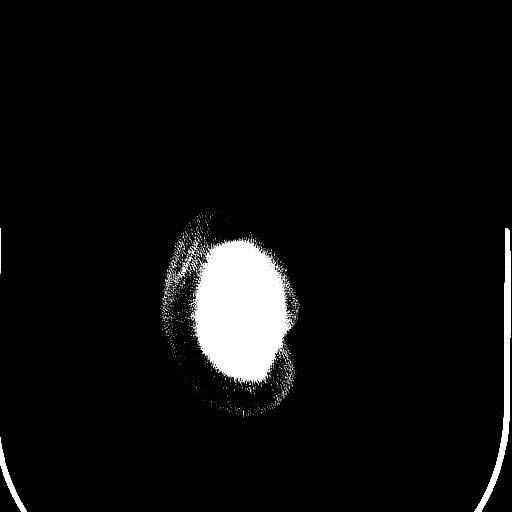

[14 of 30 positions shown; findings below may reference images not displayed]

FINDINGS: Age advanced cerebral atrophy and ventriculomegaly. No extra-axial
fluid collections are identified. No CT findings for acute
hemispheric infarction or intracranial hemorrhage. No mass lesions.
The brainstem and cerebellum are grossly normal. Vascular
calcifications are noted.

The bony structures are intact. The paranasal sinuses and mastoid
air cells are clear. Patient has had prior left mastoid surgery with
mastoid bowl procedure.
IMPRESSION: No CT findings for an acute intracranial process.

## 2016-03-23 IMAGING — MR MR HEAD W/O CM
5 series · 48 of 48 positions shown · non-contrast
Comparison: Head CT without contrast 8424 hours the same day.

CLINICAL DATA: 53-year-old male code stroke with slurred speech and
facial droop. Initial encounter.

EXAM:
MRI HEAD WITHOUT CONTRAST
TECHNIQUE: Multiplanar, multiecho pulse sequences of the brain and surrounding
structures were obtained without intravenous contrast.

[Series 3: DWI · axial · 3.0mm · 1.09mm/px · z∈[-77,+79]mm · 16 of 106 slices shown (1 of 4)]
[im 1/106]
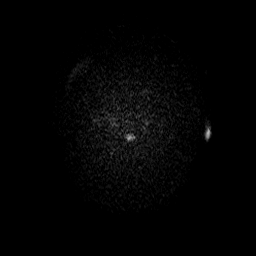
[im 8/106]
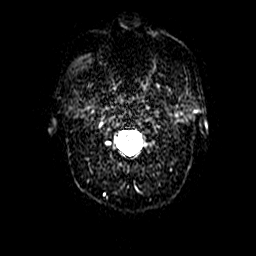
[im 15/106]
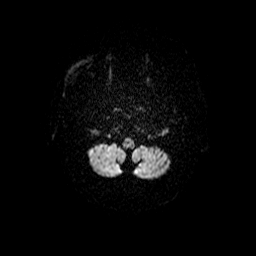
[im 22/106]
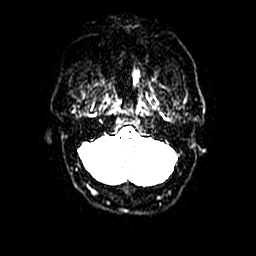
[im 29/106]
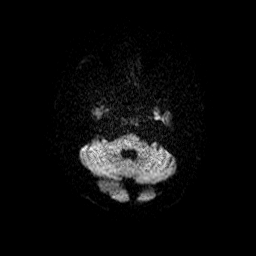
[im 36/106]
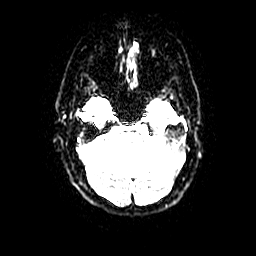
[im 43/106]
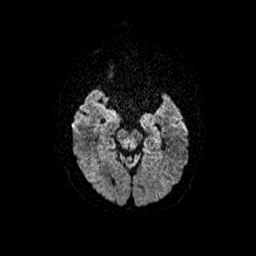
[im 50/106]
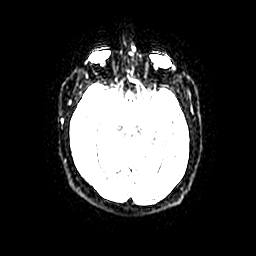
[im 57/106]
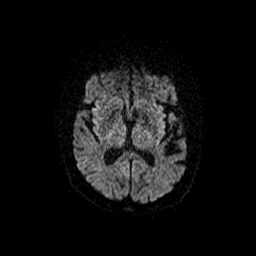
[im 64/106]
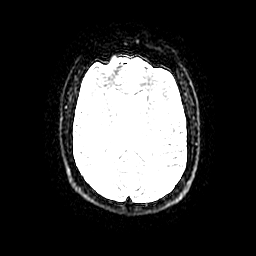
[im 71/106]
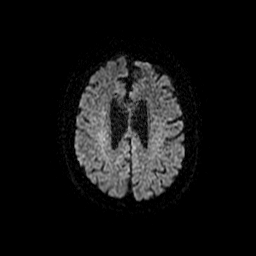
[im 78/106]
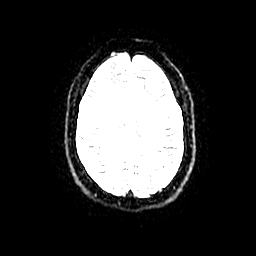
[im 85/106]
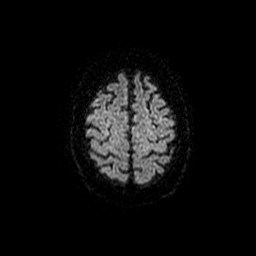
[im 92/106]
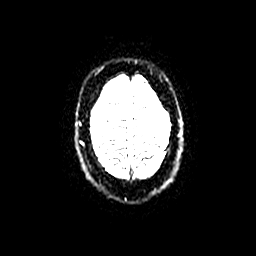
[im 99/106]
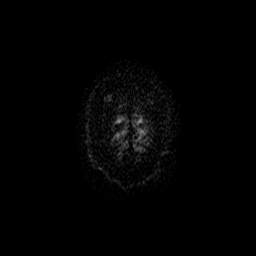
[im 106/106]
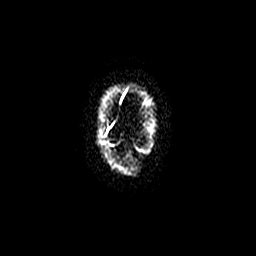

[Series 4: FLAIR · axial · 5.0mm · 0.43mm/px · z∈[-88,+74]mm · 5 of 28 slices shown]
[im 1/28]
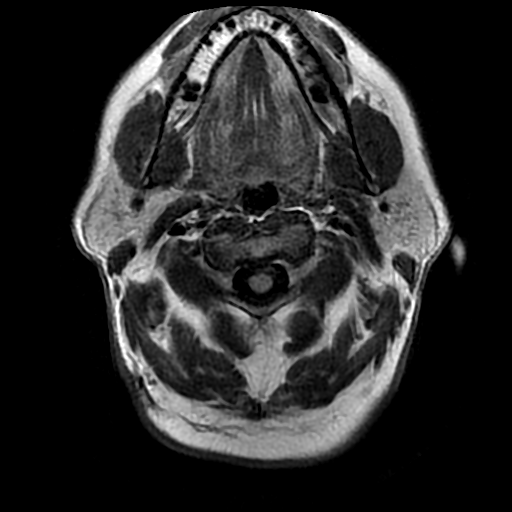
[im 7/28]
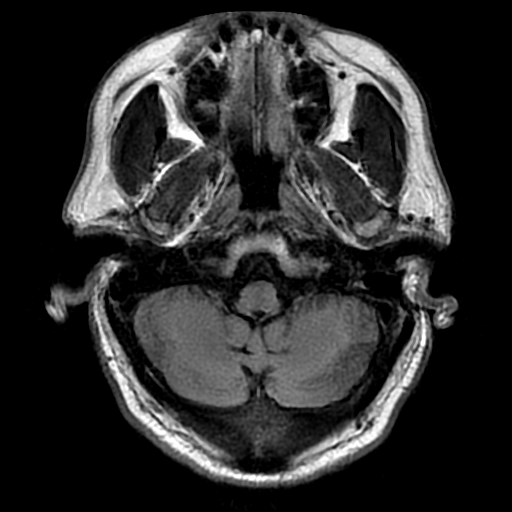
[im 14/28]
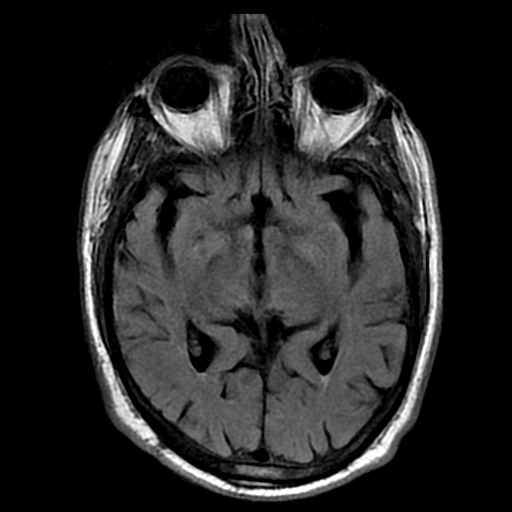
[im 21/28]
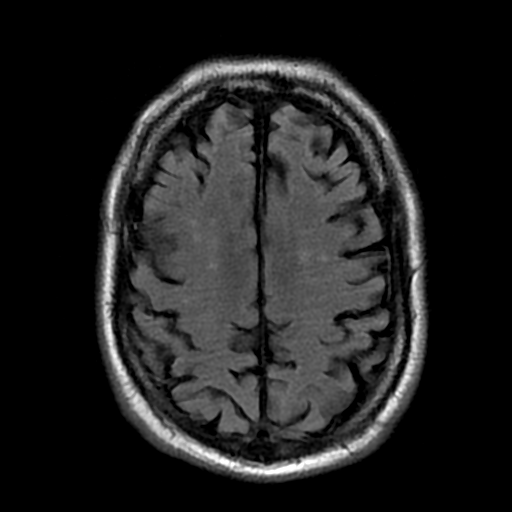
[im 28/28]
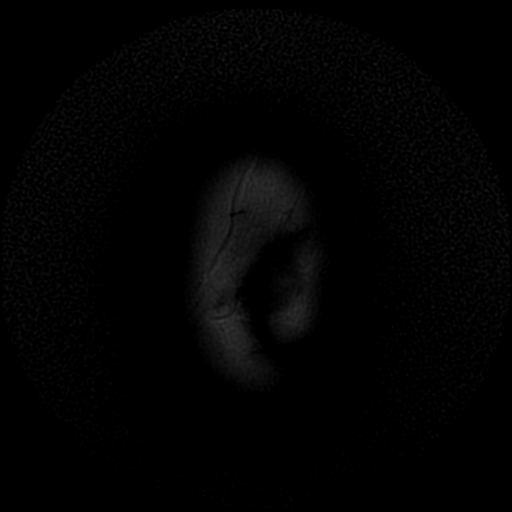

[Series 5: DWI · coronal · 5.0mm · 1.09mm/px · 12 of 72 slices shown (2 of 4)]
[im 1/72]
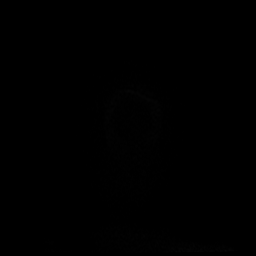
[im 7/72]
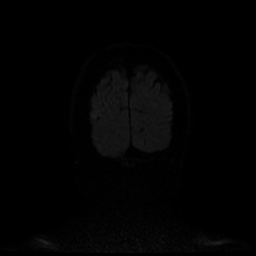
[im 13/72]
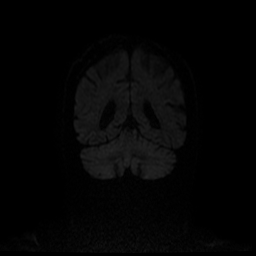
[im 20/72]
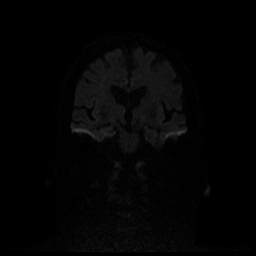
[im 26/72]
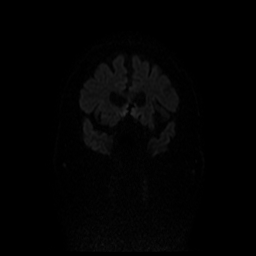
[im 33/72]
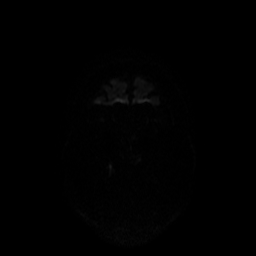
[im 39/72]
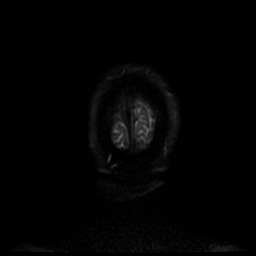
[im 46/72]
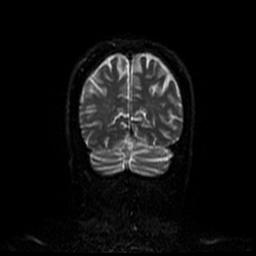
[im 52/72]
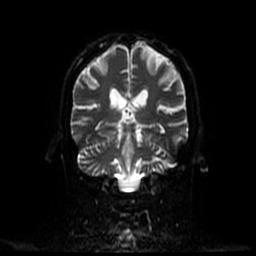
[im 59/72]
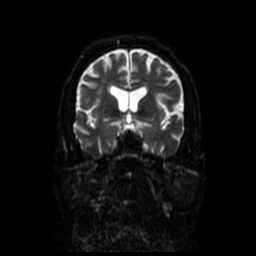
[im 65/72]
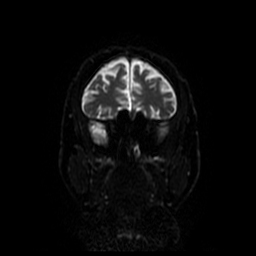
[im 72/72]
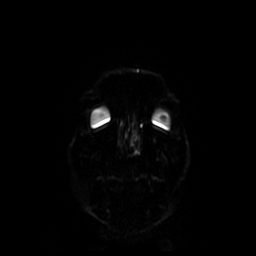

[Series 300: DWI · axial · 3.0mm · 1.09mm/px · z∈[-77,+79]mm · 9 of 53 slices shown (3 of 4)]
[im 1/53]
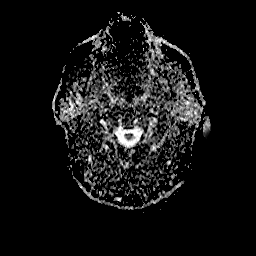
[im 7/53]
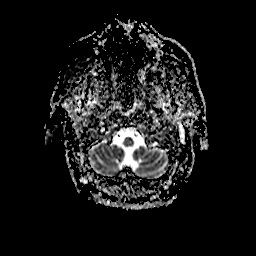
[im 14/53]
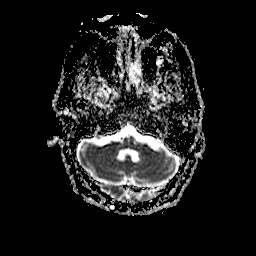
[im 20/53]
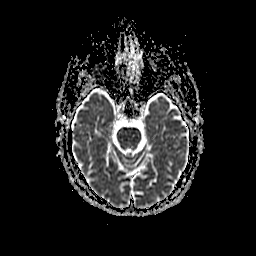
[im 27/53]
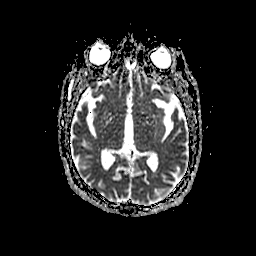
[im 33/53]
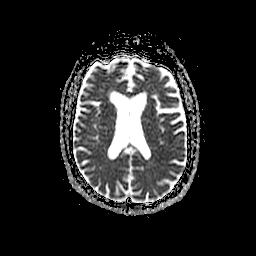
[im 40/53]
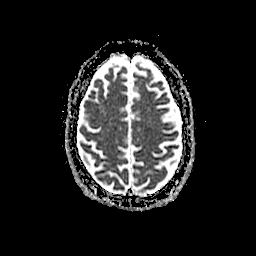
[im 46/53]
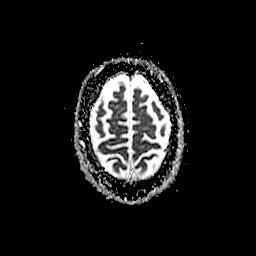
[im 53/53]
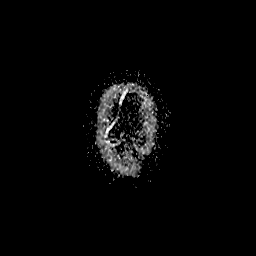

[Series 500: DWI · coronal · 5.0mm · 1.09mm/px · 6 of 36 slices shown (4 of 4)]
[im 1/36]
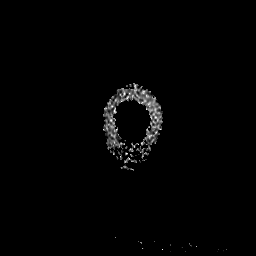
[im 8/36]
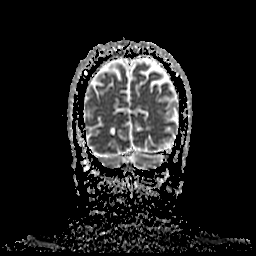
[im 15/36]
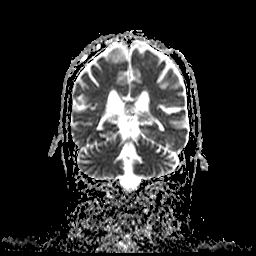
[im 22/36]
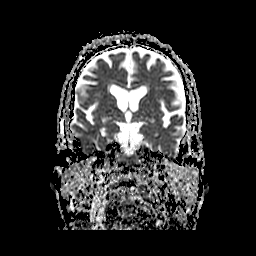
[im 29/36]
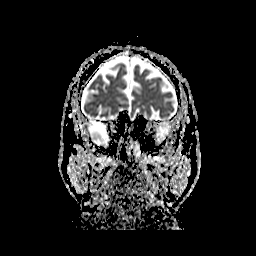
[im 36/36]
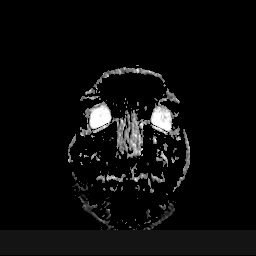

[48 of 48 positions shown; findings below may reference images not displayed]

FINDINGS: Noncontrast MRI imaging of the brain was limited to
diffusion-weighted (axial and coronal), and axial FLAIR imaging.

No restricted diffusion or evidence of acute infarction. Patchy
cerebral white matter FLAIR hyperintensity, mild to moderate for
age. No cortical encephalomalacia identified. No midline shift, mass
effect, or evidence of intracranial mass lesion. No
ventriculomegaly.
IMPRESSION: No evidence of acute ischemia. Study discussed by telephone with Dr.
MONADIL LOC on 08/05/2014 at [DATE] .
